# Patient Record
Sex: Female | Born: 1955 | Race: White | Hispanic: No | Marital: Married | Smoking: Current every day smoker
Health system: Southern US, Community
[De-identification: ages and names within clinical notes are randomized; demographics above are authoritative.]

## PROBLEM LIST (undated history)

## (undated) DIAGNOSIS — N393 Stress incontinence (female) (male): Secondary | ICD-10-CM

## (undated) DIAGNOSIS — F32A Depression, unspecified: Secondary | ICD-10-CM

## (undated) DIAGNOSIS — F329 Major depressive disorder, single episode, unspecified: Secondary | ICD-10-CM

## (undated) DIAGNOSIS — B019 Varicella without complication: Secondary | ICD-10-CM

## (undated) DIAGNOSIS — M1990 Unspecified osteoarthritis, unspecified site: Secondary | ICD-10-CM

## (undated) DIAGNOSIS — I10 Essential (primary) hypertension: Secondary | ICD-10-CM

## (undated) HISTORY — DX: Unspecified osteoarthritis, unspecified site: M19.90

## (undated) HISTORY — DX: Depression, unspecified: F32.A

## (undated) HISTORY — PX: ROTATOR CUFF REPAIR: SHX139

## (undated) HISTORY — DX: Essential (primary) hypertension: I10

## (undated) HISTORY — DX: Stress incontinence (female) (male): N39.3

## (undated) HISTORY — DX: Varicella without complication: B01.9

## (undated) HISTORY — DX: Major depressive disorder, single episode, unspecified: F32.9

## (undated) HISTORY — PX: KNEE SURGERY: SHX244

---

## 2007-10-26 ENCOUNTER — Ambulatory Visit: Payer: Self-pay | Admitting: Orthopaedic Surgery

## 2007-11-10 ENCOUNTER — Ambulatory Visit: Payer: Self-pay | Admitting: Orthopaedic Surgery

## 2008-01-04 ENCOUNTER — Ambulatory Visit: Payer: Self-pay | Admitting: Family Medicine

## 2010-01-06 ENCOUNTER — Ambulatory Visit: Payer: Self-pay | Admitting: Family Medicine

## 2012-02-22 ENCOUNTER — Ambulatory Visit: Payer: Self-pay | Admitting: Unknown Physician Specialty

## 2012-03-20 ENCOUNTER — Ambulatory Visit: Payer: Self-pay | Admitting: Unknown Physician Specialty

## 2015-03-30 NOTE — Op Note (Signed)
PATIENT NAME:  Theresa Bridges, Theresa B MR#:  [id number] DATE OF BIRTH:  1956-11-02  DATE OF PROCEDURE:  03/20/2012  PREOPERATIVE DIAGNOSIS: Torn medial meniscus, right knee.   POSTOPERATIVE DIAGNOSIS: Torn medial meniscus plus grade 3 medial femoral chondral changes.   OPERATION: Arthroscopic partial medial meniscectomy on the right plus debridement and coblation of the medial femoral chondral lesion.   SURGEON: Theresa Bridges, Jr., M.D.   ANESTHESIA: General.   HISTORY: The patient had a long history of right knee pain. Plain films have revealed very minimal narrowing of her medial compartment. MRI was consistent primarily with a torn medial meniscus.   The patient was ultimately brought in for arthroscopy of her right knee due to failure to respond to conservative treatment.   PROCEDURE: The patient was taken the operating room where satisfactory general anesthesia was achieved. A tourniquet and legholder were applied to her right thigh and the left lower extremity was supported with a well legholder. The right knee was then prepped and draped in the usual fashion for a knee procedure.   An inflow cannula was introduced superomedially. The joint was distended with lactated Ringer's. We used the Mitek fluid pump to facilitate joint distention.   The scope was introduced through an inferolateral puncture wound and a probe through an inferomedial puncture wound. Inspection of the medial compartment revealed the patient had grade 3 chondral changes over most of her medial femoral condyle. There was some fibrillation of her medial tibial plateau. It was an inferiorly based incomplete tear of the posterior horn of the medial meniscus.   Inspection of the intercondylar notch failed to reveal that the cruciates were intact. Inspection of the lateral compartment along with probing of the lateral meniscus failed to reveal any significant pathology.   The scope was placed back in the medial compartment. I  went ahead and resected the medial meniscus tear. I actually just resected a portion of the inner margin of the posterior horn and then used a basket biter to resect the inferior leaf of the torn medial meniscus. A synovial resector was inserted to further debride this area. Next, I trephinated the posterior horn using an 18-gauge spinal needle.   I then introduced a Turbo whisker to debride the medial femoral chondral lesion. A basket biter was also inserted to remove some loose fragments around the edge of the lesion and then I inserted an ArthroCare Paragon wand into the medial compartment and used it to coblate the lesion.   The scope was then introduced into the posterior recess of the medial and lateral compartments. No additional pathology was noted. The trochlear groove appeared to be fairly smooth. The patella was observed primarily from the superomedial portal. There was some fraying of the undersurface of the patella but her patellar involvement was only mild.  The patella, incidentally, seemed to track normally as viewed from the superomedial portal.   The joint was decompressed through the cannulas at this time and the cannulas were removed. The puncture wounds were closed with 3-0 nylon in vertical mattress fashion. Several mL of 0.5% Marcaine with epinephrine were injected about each puncture wound. Betadine was applied to the wound followed by a sterile dressing and an ice pack.   The patient was then awakened and transferred to her stretcher bed. She was taken to the recovery room in satisfactory condition. Blood loss was negligible. The tourniquet, incidentally, was not inflated during the course of the procedure.   ____________________________ Theresa BertholdHarold B. Mohammedali Bedoy Jr., MD  hbk:bjt D: 03/20/2012 13:09:31 ET T: 03/20/2012 16:48:04 ET JOB#: [id number]  cc: Theresa Berthold., MD, <Dictator> Randon Goldsmith, JR MD ELECTRONICALLY SIGNED 03/26/2012 21:33

## 2015-04-01 ENCOUNTER — Ambulatory Visit: Payer: Self-pay | Admitting: Primary Care

## 2015-04-30 ENCOUNTER — Encounter: Payer: Self-pay | Admitting: Primary Care

## 2015-04-30 ENCOUNTER — Encounter (INDEPENDENT_AMBULATORY_CARE_PROVIDER_SITE_OTHER): Payer: Self-pay

## 2015-04-30 ENCOUNTER — Ambulatory Visit (INDEPENDENT_AMBULATORY_CARE_PROVIDER_SITE_OTHER): Payer: BC Managed Care – PPO | Admitting: Primary Care

## 2015-04-30 VITALS — BP 186/112 | HR 110 | Temp 98.6°F | Ht 63.5 in | Wt 178.8 lb

## 2015-04-30 DIAGNOSIS — I10 Essential (primary) hypertension: Secondary | ICD-10-CM

## 2015-04-30 DIAGNOSIS — F32A Depression, unspecified: Secondary | ICD-10-CM

## 2015-04-30 DIAGNOSIS — F419 Anxiety disorder, unspecified: Secondary | ICD-10-CM | POA: Insufficient documentation

## 2015-04-30 DIAGNOSIS — F329 Major depressive disorder, single episode, unspecified: Secondary | ICD-10-CM

## 2015-04-30 MED ORDER — LISINOPRIL 10 MG PO TABS: 10.0000 mg | ORAL_TABLET | Freq: Every day | ORAL | Status: DC

## 2015-04-30 MED ORDER — VENLAFAXINE HCL ER 150 MG PO CP24: 150.0000 mg | ORAL_CAPSULE | Freq: Every day | ORAL | Status: DC

## 2015-04-30 NOTE — Assessment & Plan Note (Signed)
History of elevated readings without diagnosis. Elevated today in office. Start lisinopril 10 mg tablets daily. Side effects explained. BMP next visit. Follow up in 2 weeks for re-evaluation.

## 2015-04-30 NOTE — Patient Instructions (Signed)
Your blood pressure is elevated in our office today. Start Lisinopril 10 mg tablets. Take 1 tablet by mouth once daily for high blood pressure. Refills have been provided for your Effexor. Please schedule a physical with me in the next 2-3 months at your convienence. You will also schedule a lab only appointment one week prior. We will discuss your lab results during your physical. It was a pleasure to meet you today! Please don't hesitate to call me with any questions. Welcome to [facility]!  Follow up in 2 weeks for recheck of your blood pressure.  Hypertension Hypertension, commonly called high blood pressure, is when the force of blood pumping through your arteries is too strong. Your arteries are the blood vessels that carry blood from your heart throughout your body. A blood pressure reading consists of a higher number over a lower number, such as 110/72. The higher number (systolic) is the pressure inside your arteries when your heart pumps. The lower number (diastolic) is the pressure inside your arteries when your heart relaxes. Ideally you want your blood pressure below 120/80. Hypertension forces your heart to work harder to pump blood. Your arteries may become narrow or stiff. Having hypertension puts you at risk for heart disease, stroke, and other problems.  RISK FACTORS Some risk factors for high blood pressure are controllable. Others are not.  Risk factors you cannot control include:   Race. You may be at higher risk if you are African American.  Age. Risk increases with age.  Gender. Men are at higher risk than women before age 57 years. After age 365, women are at higher risk than men. Risk factors you can control include:  Not getting enough exercise or physical activity.  Being overweight.  Getting too much fat, sugar, calories, or salt in your diet.  Drinking too much alcohol. SIGNS AND SYMPTOMS Hypertension does not usually cause signs or symptoms. Extremely high  blood pressure (hypertensive crisis) may cause headache, anxiety, shortness of breath, and nosebleed. DIAGNOSIS  To check if you have hypertension, your health care provider will measure your blood pressure while you are seated, with your arm held at the level of your heart. It should be measured at least twice using the same arm. Certain conditions can cause a difference in blood pressure between your right and left arms. A blood pressure reading that is higher than normal on one occasion does not mean that you need treatment. If one blood pressure reading is high, ask your health care provider about having it checked again. TREATMENT  Treating high blood pressure includes making lifestyle changes and possibly taking medicine. Living a healthy lifestyle can help lower high blood pressure. You may need to change some of your habits. Lifestyle changes may include:  Following the DASH diet. This diet is high in fruits, vegetables, and whole grains. It is low in salt, red meat, and added sugars.  Getting at least 2 hours of brisk physical activity every week.  Losing weight if necessary.  Not smoking.  Limiting alcoholic beverages.  Learning ways to reduce stress. If lifestyle changes are not enough to get your blood pressure under control, your health care provider may prescribe medicine. You may need to take more than one. Work closely with your health care provider to understand the risks and benefits. HOME CARE INSTRUCTIONS  Have your blood pressure rechecked as directed by your health care provider.   Take medicines only as directed by your health care provider. Follow the directions carefully.  Blood pressure medicines must be taken as prescribed. The medicine does not work as well when you skip doses. Skipping doses also puts you at risk for problems.   Do not smoke.   Monitor your blood pressure at home as directed by your health care provider. SEEK MEDICAL CARE IF:   You  think you are having a reaction to medicines taken.  You have recurrent headaches or feel dizzy.  You have swelling in your ankles.  You have trouble with your vision. SEEK IMMEDIATE MEDICAL CARE IF:  You develop a severe headache or confusion.  You have unusual weakness, numbness, or feel faint.  You have severe chest or abdominal pain.  You vomit repeatedly.  You have trouble breathing. MAKE SURE YOU:   Understand these instructions.  Will watch your condition.  Will get help right away if you are not doing well or get worse. Document Released: 11/22/2005 Document Revised: 04/08/2014 Document Reviewed: 09/14/2013 [facility] Patient Information 2015 [place], [place]. This information is not intended to replace advice given to you by your health care provider. Make sure you discuss any questions you have with your health care provider.

## 2015-04-30 NOTE — Progress Notes (Signed)
Pre visit review using our clinic review tool, if applicable. No additional management support is needed unless otherwise documented below in the visit note. 

## 2015-04-30 NOTE — Progress Notes (Signed)
Subjective:    Patient ID: Theresa Bridges, female    DOB: [birth year], 59 y.o.   MRN: [id number]  HPI  Theresa Bridges is a 59 year old female who presents today to establish care and discuss the problems mentioned below. Will obtain old records.  1) Elevated blood pressure readings: elevated readings have been present intermittently for 2-3 years when she presented for various surgeries and had visits with other providers. She's not had consistent care by a PCP in the past 5 years. Denies headaches, but can feel when her blood pressure is elevated. Denies chest pain, SOB, visual disturbance.   BP Readings from Last 3 Encounters:  04/30/15 186/112    2) Depression: Has been present for most of her life. She is managed on Effexor XR several years ago and feels well managed on this medication. She is out of her medication and had been getting refills from Dr. Terance Hart at [facility] in [place].   3) Family history of Breast Cancer: Mother and paternal grandmother. She has not been getting routine mammograms. Last mammogram was 5 years ago. Denies lumps, pain, changes in tissue.  Review of Systems  Constitutional: Negative for unexpected weight change.  HENT: Negative for rhinorrhea.   Eyes: Negative for visual disturbance.  Respiratory: Negative for cough and shortness of breath.   Cardiovascular: Negative for chest pain and leg swelling.  Gastrointestinal: Negative for diarrhea and constipation.  Genitourinary:       Stress incontinence occasionally. Mild. Not bothersome.  Musculoskeletal: Negative for myalgias and arthralgias.  Skin:       Ganglion cyst present since September 2015.  Neurological: Negative for dizziness, numbness and headaches.  Psychiatric/Behavioral:       See HPI       Past Medical History  Diagnosis Date  . Chicken pox   . Depression   . Hypertension   . Stress incontinence     History   Social History  . Marital Status: Married    Spouse Name: N/A    . Number of Children: N/A  . Years of Education: N/A   Occupational History  . Not on file.   Social History Main Topics  . Smoking status: Current Every Day Smoker -- 0.50 packs/day    Types: Cigarettes  . Smokeless tobacco: Not on file  . Alcohol Use: 0.0 oz/week    0 Standard drinks or equivalent per week     Comment: 1 to 2 glass of wine daily  . Drug Use: Not on file  . Sexual Activity: Not on file   Other Topics Concern  . Not on file   Social History Narrative   Married.   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   Enjoys swimming, gardening, growing herbs, playing piano, reading.    Past Surgical History  Procedure Laterality Date  . Rotator cuff repair    . Knee surgery      Family History  Problem Relation Age of Onset  . Arthritis Mother   . Cancer Mother     breast  . Arthritis Father   . Hypertension Father   . Cancer Paternal Grandmother     breast    No Known Allergies  No current outpatient prescriptions on file prior to visit.   No current facility-administered medications on file prior to visit.    BP 186/112 mmHg  Pulse 110  Temp(Src) 98.6 F (37 C) (Oral)  Ht 5' 3.5" (1.613 m)  Wt 178 lb  12.8 oz (81.103 kg)  BMI 31.17 kg/m2  SpO2 97%    Objective:   Physical Exam  Constitutional: She is oriented to person, place, and time. She appears well-nourished.  Neck: Neck supple.  Cardiovascular: Normal rate and regular rhythm.   Pulmonary/Chest: Effort normal and breath sounds normal.  Musculoskeletal: Normal range of motion.  Neurological: She is alert and oriented to person, place, and time.  Skin: Skin is warm and dry.  Psychiatric: She has a normal mood and affect.          Assessment & Plan:

## 2015-04-30 NOTE — Assessment & Plan Note (Signed)
Managed well on Effexor XR. Refills provided. Denies SI/HI. Will continue to monitor.

## 2015-05-21 ENCOUNTER — Encounter: Payer: Self-pay | Admitting: Primary Care

## 2015-05-21 ENCOUNTER — Ambulatory Visit (INDEPENDENT_AMBULATORY_CARE_PROVIDER_SITE_OTHER): Payer: BC Managed Care – PPO | Admitting: Primary Care

## 2015-05-21 VITALS — BP 182/94 | HR 89 | Temp 98.1°F | Wt 175.5 lb

## 2015-05-21 DIAGNOSIS — I10 Essential (primary) hypertension: Secondary | ICD-10-CM

## 2015-05-21 LAB — BASIC METABOLIC PANEL
BUN: 10 mg/dL (ref 6–23)
CHLORIDE: 102 meq/L (ref 96–112)
CO2: 28 meq/L (ref 19–32)
Calcium: 9.4 mg/dL (ref 8.4–10.5)
Creatinine, Ser: 0.67 mg/dL (ref 0.40–1.20)
GFR: 96.31 mL/min (ref >60.00)
Glucose, Bld: 81 mg/dL (ref 70–99)
POTASSIUM: 4.5 meq/L (ref 3.5–5.1)
Sodium: 137 mEq/L (ref 135–145)

## 2015-05-21 MED ORDER — LISINOPRIL 20 MG PO TABS: 20.0000 mg | ORAL_TABLET | Freq: Every day | ORAL | Status: DC

## 2015-05-21 NOTE — Patient Instructions (Addendum)
Increase your Lisinopril from 10 mg to 20 mg. You may take 2 of the 10 mg tablets until you run out.  I will send the 20 mg tablets to your pharmacy later this afternoon.  Complete lab work prior to leaving today. I will notify you of your results.  Continue monitoring your blood pressure as you have done.  Continue your efforts of a healthy diet and exercise.  Follow up in 2 weeks for re-evaluation. It was nice to see you!  Hypertension Hypertension, commonly called high blood pressure, is when the force of blood pumping through your arteries is too strong. Your arteries are the blood vessels that carry blood from your heart throughout your body. A blood pressure reading consists of a higher number over a lower number, such as 110/72. The higher number (systolic) is the pressure inside your arteries when your heart pumps. The lower number (diastolic) is the pressure inside your arteries when your heart relaxes. Ideally you want your blood pressure below 120/80. Hypertension forces your heart to work harder to pump blood. Your arteries may become narrow or stiff. Having hypertension puts you at risk for heart disease, stroke, and other problems.  RISK FACTORS Some risk factors for high blood pressure are controllable. Others are not.  Risk factors you cannot control include:   Race. You may be at higher risk if you are African American.  Age. Risk increases with age.  Gender. Men are at higher risk than women before age 73 years. After age 15, women are at higher risk than men. Risk factors you can control include:  Not getting enough exercise or physical activity.  Being overweight.  Getting too much fat, sugar, calories, or salt in your diet.  Drinking too much alcohol. SIGNS AND SYMPTOMS Hypertension does not usually cause signs or symptoms. Extremely high blood pressure (hypertensive crisis) may cause headache, anxiety, shortness of breath, and nosebleed. DIAGNOSIS  To check if  you have hypertension, your health care provider will measure your blood pressure while you are seated, with your arm held at the level of your heart. It should be measured at least twice using the same arm. Certain conditions can cause a difference in blood pressure between your right and left arms. A blood pressure reading that is higher than normal on one occasion does not mean that you need treatment. If one blood pressure reading is high, ask your health care provider about having it checked again. TREATMENT  Treating high blood pressure includes making lifestyle changes and possibly taking medicine. Living a healthy lifestyle can help lower high blood pressure. You may need to change some of your habits. Lifestyle changes may include:  Following the DASH diet. This diet is high in fruits, vegetables, and whole grains. It is low in salt, red meat, and added sugars.  Getting at least 2 hours of brisk physical activity every week.  Losing weight if necessary.  Not smoking.  Limiting alcoholic beverages.  Learning ways to reduce stress. If lifestyle changes are not enough to get your blood pressure under control, your health care provider may prescribe medicine. You may need to take more than one. Work closely with your health care provider to understand the risks and benefits. HOME CARE INSTRUCTIONS  Have your blood pressure rechecked as directed by your health care provider.   Take medicines only as directed by your health care provider. Follow the directions carefully. Blood pressure medicines must be taken as prescribed. The medicine does not work  as well when you skip doses. Skipping doses also puts you at risk for problems.   Do not smoke.   Monitor your blood pressure at home as directed by your health care provider. SEEK MEDICAL CARE IF:   You think you are having a reaction to medicines taken.  You have recurrent headaches or feel dizzy.  You have swelling in your  ankles.  You have trouble with your vision. SEEK IMMEDIATE MEDICAL CARE IF:  You develop a severe headache or confusion.  You have unusual weakness, numbness, or feel faint.  You have severe chest or abdominal pain.  You vomit repeatedly.  You have trouble breathing. MAKE SURE YOU:   Understand these instructions.  Will watch your condition.  Will get help right away if you are not doing well or get worse. Document Released: 11/22/2005 Document Revised: 04/08/2014 Document Reviewed: 09/14/2013 [facility] Patient Information 2015 [place], [place]. This information is not intended to replace advice given to you by your health care provider. Make sure you discuss any questions you have with your health care provider.

## 2015-05-21 NOTE — Progress Notes (Signed)
Pre visit review using our clinic review tool, if applicable. No additional management support is needed unless otherwise documented below in the visit note. 

## 2015-05-21 NOTE — Assessment & Plan Note (Signed)
Readings at home show improvement but not at goal. Elevated reading today in clinic. Increase Lisinopril to 20 mg. BMP today for evaluation of potassium. Follow up in 2 weeks.

## 2015-05-21 NOTE — Progress Notes (Signed)
   Subjective:    Patient ID: Theresa Bridges, female    DOB: Mar 11, 1956, 59 y.o.   MRN: [id number]  HPI  Theresa Bridges is a 59 year old female who presents today for follow up of hypertension. She was diagnosed during her new patient visit on 5/25 and placed on Lisinopril 10 mg tablets. Immediatly after staring the Lisinopril she began feeling much better overall. She purchased a blood pressure cuff from [facility] and has been recording her readings. On average she has been getting 140's/90's. She's been recently exercising daily at the [facility]. She does report head pressure that has been present over the past 2 days. BP noted to be elevated in clinic today.   BP Readings from Last 3 Encounters:  05/21/15 182/94  04/30/15 186/112     Review of Systems  Eyes: Negative for visual disturbance.  Respiratory: Negative for cough and shortness of breath.   Cardiovascular: Negative for chest pain.  Neurological: Positive for headaches. Negative for dizziness.       Past Medical History  Diagnosis Date  . Chicken pox   . Depression   . Hypertension   . Stress incontinence     History   Social History  . Marital Status: Married    Spouse Name: N/A  . Number of Children: N/A  . Years of Education: N/A   Occupational History  . Not on file.   Social History Main Topics  . Smoking status: Current Every Day Smoker -- 0.50 packs/day    Types: Cigarettes  . Smokeless tobacco: Not on file  . Alcohol Use: 0.0 oz/week    0 Standard drinks or equivalent per week     Comment: 1 to 2 glass of wine daily  . Drug Use: Not on file  . Sexual Activity: Not on file   Other Topics Concern  . Not on file   Social History Narrative   Married.   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   Enjoys swimming, gardening, growing herbs, playing piano, reading.    Past Surgical History  Procedure Laterality Date  . Rotator cuff repair    . Knee surgery      Family History  Problem Relation Age of  Onset  . Arthritis Mother   . Cancer Mother     breast  . Arthritis Father   . Hypertension Father   . Cancer Paternal Grandmother     breast    No Known Allergies  Current Outpatient Prescriptions on File Prior to Visit  Medication Sig Dispense Refill  . lisinopril (PRINIVIL,ZESTRIL) 10 MG tablet Take 1 tablet (10 mg total) by mouth daily. 30 tablet 3  . venlafaxine XR (EFFEXOR-XR) 150 MG 24 hr capsule Take 1 capsule (150 mg total) by mouth daily with breakfast. 30 capsule 11   No current facility-administered medications on file prior to visit.    BP 182/94 mmHg  Pulse 89  Temp(Src) 98.1 F (36.7 C) (Oral)  Wt 175 lb 8 oz (79.606 kg)  SpO2 95%    Objective:   Physical Exam  Cardiovascular: Normal rate and regular rhythm.   Pulmonary/Chest: Effort normal and breath sounds normal.  Skin: Skin is warm and dry.          Assessment & Plan:

## 2015-06-03 ENCOUNTER — Other Ambulatory Visit: Payer: Self-pay | Admitting: Primary Care

## 2015-06-03 DIAGNOSIS — I10 Essential (primary) hypertension: Secondary | ICD-10-CM

## 2015-06-03 DIAGNOSIS — Z0000 Encounter for general adult medical examination without abnormal findings: Secondary | ICD-10-CM

## 2015-06-04 ENCOUNTER — Ambulatory Visit (INDEPENDENT_AMBULATORY_CARE_PROVIDER_SITE_OTHER): Payer: BC Managed Care – PPO | Admitting: Primary Care

## 2015-06-04 ENCOUNTER — Encounter: Payer: Self-pay | Admitting: Primary Care

## 2015-06-04 VITALS — BP 170/84 | HR 110 | Temp 97.5°F | Wt 176.5 lb

## 2015-06-04 DIAGNOSIS — I10 Essential (primary) hypertension: Secondary | ICD-10-CM

## 2015-06-04 MED ORDER — HYDROCHLOROTHIAZIDE 25 MG PO TABS: 25.0000 mg | ORAL_TABLET | Freq: Every day | ORAL | Status: DC

## 2015-06-04 NOTE — Progress Notes (Signed)
Pre visit review using our clinic review tool, if applicable. No additional management support is needed unless otherwise documented below in the visit note. 

## 2015-06-04 NOTE — Patient Instructions (Signed)
Start Hydrochlorothiazide tablets for blood pressure. Take 1 tablet by mouth daily. Continue taking Lisinopril 20 mg tablets.  Keep up the good work with exercise and diet!  Follow up in 3 weeks for re-evaluation of blood pressure.   It was nice seeing you!

## 2015-06-04 NOTE — Progress Notes (Signed)
   Subjective:    Patient ID: Theresa Bridges, female    DOB: 1956/02/27, 59 y.o.   MRN: [id number]  HPI  Theresa Bridges is a 59 year old female who presents today for follow up. She was evaluated on 5/25 as a new patient and was started on Lisinopril 10 mg for hypertension. She then followed up on 6/15 and was noted to still be hypertensive. Her dose was increased to 20 mg last visit.  Her blood pressure remains elevated today. She is [facility] at the [facility] including weights, cardio, and swimming. She's improved her diet and is incorporating fresh fruits and vegetables. Overall she feels great. Denies headaches, chest pain, shortness of breath.    BP Readings from Last 3 Encounters:  06/04/15 170/84  05/21/15 182/94  04/30/15 186/112      Review of Systems  Eyes: Negative for visual disturbance.  Respiratory: Negative for shortness of breath.   Cardiovascular: Negative for chest pain.  Neurological: Negative for headaches.       Past Medical History  Diagnosis Date  . Chicken pox   . Depression   . Hypertension   . Stress incontinence     History   Social History  . Marital Status: Married    Spouse Name: N/A  . Number of Children: N/A  . Years of Education: N/A   Occupational History  . Not on file.   Social History Main Topics  . Smoking status: Current Every Day Smoker -- 0.20 packs/day    Types: Cigarettes  . Smokeless tobacco: Not on file  . Alcohol Use: 0.0 oz/week    0 Standard drinks or equivalent per week     Comment: 1 to 2 glass of wine daily  . Drug Use: No  . Sexual Activity: Not on file   Other Topics Concern  . Not on file   Social History Narrative   Married.   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   Enjoys swimming, gardening, growing herbs, playing piano, reading.    Past Surgical History  Procedure Laterality Date  . Rotator cuff repair    . Knee surgery      Family History  Problem Relation Age of Onset  . Arthritis Mother    . Cancer Mother     breast  . Arthritis Father   . Hypertension Father   . Cancer Paternal Grandmother     breast    No Known Allergies  Current Outpatient Prescriptions on File Prior to Visit  Medication Sig Dispense Refill  . lisinopril (PRINIVIL,ZESTRIL) 20 MG tablet Take 1 tablet (20 mg total) by mouth daily. 30 tablet 3  . venlafaxine XR (EFFEXOR-XR) 150 MG 24 hr capsule Take 1 capsule (150 mg total) by mouth daily with breakfast. 30 capsule 11   No current facility-administered medications on file prior to visit.    BP 170/84 mmHg  Pulse 110  Temp(Src) 97.5 F (36.4 C) (Axillary)  Wt 176 lb 8 oz (80.06 kg)  SpO2 97%    Objective:   Physical Exam  Constitutional: She appears well-nourished.  Cardiovascular: Normal rate and regular rhythm.   Pulmonary/Chest: Effort normal and breath sounds normal.  Skin: Skin is warm and dry.          Assessment & Plan:

## 2015-06-04 NOTE — Assessment & Plan Note (Addendum)
Uncontrolled, very little improvement. Endorses healthy diet and daily exercise. Add HCTZ 25 mg to Lisinopril 20 mg. Follow up in 3 weeks for re-evaluation, if not controlled will increase lisinopril.

## 2015-06-12 ENCOUNTER — Other Ambulatory Visit: Payer: BC Managed Care – PPO

## 2015-06-16 ENCOUNTER — Encounter: Payer: BC Managed Care – PPO | Admitting: Primary Care

## 2015-06-24 ENCOUNTER — Ambulatory Visit: Payer: BC Managed Care – PPO | Admitting: Primary Care

## 2015-07-15 ENCOUNTER — Telehealth: Payer: Self-pay | Admitting: *Deleted

## 2015-07-15 NOTE — Telephone Encounter (Signed)
Mammogram call: Left voicemail requesting pt to call us back to check status of her getting a mammogram

## 2015-08-04 ENCOUNTER — Other Ambulatory Visit: Payer: BC Managed Care – PPO

## 2015-08-12 ENCOUNTER — Encounter: Payer: BC Managed Care – PPO | Admitting: Primary Care

## 2015-08-21 ENCOUNTER — Other Ambulatory Visit: Payer: Self-pay | Admitting: Primary Care

## 2015-08-31 ENCOUNTER — Other Ambulatory Visit: Payer: Self-pay | Admitting: Primary Care

## 2015-09-01 NOTE — Telephone Encounter (Signed)
Message left for patient to return my call to schedule follow up appt.

## 2015-09-01 NOTE — Telephone Encounter (Signed)
Electronically refill request for  hydrochlorothiazide (HYDRODIURIL) 25 MG tablet   Take 1 tablet (25 mg total) by mouth daily.  Dispense: 30 tablet   Refills: 3     Last prescribed on 06/04/2015. Last seen on 06/04/15. Last appt on 08/12/15 was cancel.

## 2015-10-31 ENCOUNTER — Other Ambulatory Visit: Payer: Self-pay | Admitting: Primary Care

## 2015-11-03 NOTE — Telephone Encounter (Signed)
Electronically refill request for   hydrochlorothiazide (HYDRODIURIL) 25 MG tablet   Take 1 tablet (25 mg total) by mouth daily.  Dispense: 30 tablet   Refills: 3    Last prescribed on  06/04/2015. Last seen on 06/04/2015. No future appointment.

## 2015-11-06 NOTE — Telephone Encounter (Signed)
Message left for patient to return my call on 11/04/2015 and 11/06/2015

## 2015-11-06 NOTE — Telephone Encounter (Addendum)
Patient called back. Just wanted to let you know. Patient stated that she is taking lisinopril and have 1 more refill on it. Patient stated that if is okay for her to follow up in February because of financial issues. Patient still at work and will call back later.

## 2015-11-19 ENCOUNTER — Other Ambulatory Visit: Payer: Self-pay | Admitting: Primary Care

## 2015-11-19 NOTE — Telephone Encounter (Signed)
Electronically refill request for   lisinopril (PRINIVIL,ZESTRIL) 20 MG tablet   Take 1 tablet (20 mg total) by mouth daily. **Will need office visit for further refills**  Dispense: 30 tablet   Refills: 2     Last prescribed on 08/22/2015. Last seen on 06/04/15. No future appointment.

## 2015-11-20 NOTE — Telephone Encounter (Signed)
Left patient a message to call back on 11/19/2015. Will also send MyChart message to patient and letter.

## 2015-11-24 NOTE — Telephone Encounter (Signed)
On previous phone patient stated that she will call back in February to schedule a follow up. She cannot yet due financial issues.

## 2015-11-26 ENCOUNTER — Telehealth: Payer: Self-pay | Admitting: Primary Care

## 2015-11-26 DIAGNOSIS — I10 Essential (primary) hypertension: Secondary | ICD-10-CM

## 2015-11-26 MED ORDER — HYDROCHLOROTHIAZIDE 25 MG PO TABS: 25.0000 mg | ORAL_TABLET | Freq: Every day | ORAL | Status: DC

## 2015-11-26 NOTE — Telephone Encounter (Signed)
Noted. Refills sent. Will see her in February for CPE.

## 2015-11-26 NOTE — Telephone Encounter (Signed)
Pt came in regarding refill on hydrochlorothiazide (HYDRODIURIL) 25 MG tablet. She states as before with her other refill that she cannot come in for an appt due to financial problems. Pt is scheduled for 01/16/16 for her cpe and med refill. She would like a refill in the meantime if possible. The best number to contact her at is [phone].

## 2016-01-16 ENCOUNTER — Encounter: Payer: BC Managed Care – PPO | Admitting: Primary Care

## 2016-01-19 ENCOUNTER — Other Ambulatory Visit: Payer: Self-pay | Admitting: Primary Care

## 2016-01-19 DIAGNOSIS — I10 Essential (primary) hypertension: Secondary | ICD-10-CM

## 2016-01-19 NOTE — Telephone Encounter (Signed)
Electronically refill request for   lisinopril (PRINIVIL,ZESTRIL) 20 MG tablet   Take 1 tablet (20 mg total) by mouth daily. **Will need office visit for further refills**  Dispense: 30 tablet   Refills: 2     Last prescribed on 08/22/2015. Last seen on 06/04/2015. CPE is schedule on 03/15/2016.

## 2016-03-15 ENCOUNTER — Other Ambulatory Visit (HOSPITAL_COMMUNITY)
Admission: RE | Admit: 2016-03-15 | Discharge: 2016-03-15 | Disposition: A | Discharge status: Home / Self Care | Payer: BC Managed Care – PPO | Source: Ambulatory Visit | Attending: Primary Care | Admitting: Primary Care

## 2016-03-15 ENCOUNTER — Ambulatory Visit (INDEPENDENT_AMBULATORY_CARE_PROVIDER_SITE_OTHER): Payer: BC Managed Care – PPO | Admitting: Primary Care

## 2016-03-15 ENCOUNTER — Other Ambulatory Visit: Payer: Self-pay | Admitting: Primary Care

## 2016-03-15 VITALS — BP 160/98 | HR 97 | Temp 97.6°F | Ht 64.0 in | Wt 174.0 lb

## 2016-03-15 DIAGNOSIS — F329 Major depressive disorder, single episode, unspecified: Secondary | ICD-10-CM

## 2016-03-15 DIAGNOSIS — Z0000 Encounter for general adult medical examination without abnormal findings: Secondary | ICD-10-CM | POA: Diagnosis not present

## 2016-03-15 DIAGNOSIS — Z23 Encounter for immunization: Secondary | ICD-10-CM

## 2016-03-15 DIAGNOSIS — R7303 Prediabetes: Secondary | ICD-10-CM

## 2016-03-15 DIAGNOSIS — Z1239 Encounter for other screening for malignant neoplasm of breast: Secondary | ICD-10-CM | POA: Diagnosis not present

## 2016-03-15 DIAGNOSIS — I10 Essential (primary) hypertension: Secondary | ICD-10-CM

## 2016-03-15 DIAGNOSIS — Z1151 Encounter for screening for human papillomavirus (HPV): Secondary | ICD-10-CM | POA: Diagnosis not present

## 2016-03-15 DIAGNOSIS — Z1211 Encounter for screening for malignant neoplasm of colon: Secondary | ICD-10-CM

## 2016-03-15 DIAGNOSIS — Z01419 Encounter for gynecological examination (general) (routine) without abnormal findings: Secondary | ICD-10-CM | POA: Insufficient documentation

## 2016-03-15 DIAGNOSIS — Z124 Encounter for screening for malignant neoplasm of cervix: Secondary | ICD-10-CM

## 2016-03-15 DIAGNOSIS — E781 Pure hyperglyceridemia: Secondary | ICD-10-CM

## 2016-03-15 DIAGNOSIS — F32A Depression, unspecified: Secondary | ICD-10-CM

## 2016-03-15 DIAGNOSIS — R7989 Other specified abnormal findings of blood chemistry: Secondary | ICD-10-CM

## 2016-03-15 LAB — COMPREHENSIVE METABOLIC PANEL
ALBUMIN: 4.2 g/dL (ref 3.5–5.2)
ALK PHOS: 88 U/L (ref 39–117)
ALT: 15 U/L (ref 0–35)
AST: 17 U/L (ref 0–37)
BILIRUBIN TOTAL: 0.3 mg/dL (ref 0.2–1.2)
BUN: 11 mg/dL (ref 6–23)
CALCIUM: 9.4 mg/dL (ref 8.4–10.5)
CO2: 28 meq/L (ref 19–32)
CREATININE: 0.8 mg/dL (ref 0.40–1.20)
Chloride: 100 mEq/L (ref 96–112)
GFR: 78.26 mL/min (ref >60.00)
Glucose, Bld: 76 mg/dL (ref 70–99)
Potassium: 4.5 mEq/L (ref 3.5–5.1)
Sodium: 136 mEq/L (ref 135–145)
TOTAL PROTEIN: 7.5 g/dL (ref 6.0–8.3)

## 2016-03-15 LAB — LIPID PANEL
CHOLESTEROL: 194 mg/dL (ref 0–200)
HDL: 43.6 mg/dL (ref >39.00)
LDL CALC: 110 mg/dL — AB (ref 0–99)
NonHDL: 150.47
TRIGLYCERIDES: 200 mg/dL — AB (ref 0.0–149.0)
Total CHOL/HDL Ratio: 4
VLDL: 40 mg/dL (ref 0.0–40.0)

## 2016-03-15 LAB — HEMOGLOBIN A1C: HEMOGLOBIN A1C: 6.1 % (ref 4.6–6.5)

## 2016-03-15 LAB — CBC
HCT: 35.1 % — ABNORMAL LOW (ref 36.0–46.0)
HEMOGLOBIN: 11.8 g/dL — AB (ref 12.0–15.0)
MCHC: 33.6 g/dL (ref 30.0–36.0)
MCV: 96.1 fl (ref 78.0–100.0)
PLATELETS: 423 10*3/uL — AB (ref 150.0–400.0)
RBC: 3.65 Mil/uL — AB (ref 3.87–5.11)
RDW: 12.7 % (ref 11.5–15.5)
WBC: 7.7 10*3/uL (ref 4.0–10.5)

## 2016-03-15 LAB — TSH: TSH: 4.72 u[IU]/mL — ABNORMAL HIGH (ref 0.35–4.50)

## 2016-03-15 MED ORDER — LISINOPRIL 40 MG PO TABS: 40.0000 mg | ORAL_TABLET | Freq: Every day | ORAL | Status: DC

## 2016-03-15 NOTE — Assessment & Plan Note (Signed)
Tdap overdue, provided today. Pap completed today and is pending. Referral placed for colonoscopy and orders placed for mammogram.  Labs pending. Exam unremarkable. Discussed the importance of a healthy diet and regular exercise in order for weight loss and to reduce risk of other medical diseases.  Follow up in 1 year for repeat physical

## 2016-03-15 NOTE — Assessment & Plan Note (Signed)
Feels well managed on Effexor 150 mg.  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 She is working through this and feels ok. Continue same.

## 2016-03-15 NOTE — Progress Notes (Signed)
Subjective:    Patient ID: Theresa Bridges, female    DOB: Nov 02, 1956, 60 y.o.   MRN: [id number]  HPI  Theresa Bridges is a 60 year old female who presents today for complete physical.  Immunizations: -Tetanus: Unsure, believes it's been over 10 years.  -Influenza: Did not receive last season.    Diet: Endorses a healthy diet. Breakfast: Instant oatmeal with fruit Lunch: Chicken, sandwich Dinner: Skips Snacks: None Desserts: Special Occasions Beverages: Coffee, water  Exercise: She does not currently exercise.  Eye exam: Completed in 2016 Dental exam: Completed several years ago.  Colonoscopy: Never completed. Due.  Pap Smear: Completed over 3 years ago. Due. Mammogram: Completed several years ago. Due.   Review of Systems  Constitutional: Negative for unexpected weight change.  HENT: Negative for rhinorrhea.   Respiratory: Negative for cough and shortness of breath.   Cardiovascular: Negative for chest pain.  Gastrointestinal: Negative for diarrhea and constipation.  Genitourinary: Negative for difficulty urinating.  Musculoskeletal: Negative for myalgias and arthralgias.  Skin: Negative for rash.       Mass to right anterior forearm for 1.5 years. Denies recent changes in size or shape.   Allergic/Immunologic: Positive for environmental allergies.  Neurological: Negative for dizziness, numbness and headaches.  Psychiatric/Behavioral:       Overall feeling well managed       Past Medical History  Diagnosis Date  . Chicken pox   . Depression   . Hypertension   . Stress incontinence     Social History   Social History  . Marital Status: Married    Spouse Name: N/A  . Number of Children: N/A  . Years of Education: N/A   Occupational History  . Not on file.   Social History Main Topics  . Smoking status: Current Every Day Smoker -- 0.20 packs/day    Types: Cigarettes  . Smokeless tobacco: Not on file  . Alcohol Use: 0.0 oz/week    0 Standard drinks or  equivalent per week     Comment: 1 to 2 glass of wine daily  . Drug Use: No  . Sexual Activity: Not on file   Other Topics Concern  . Not on file   Social History Narrative   Married.   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   Enjoys swimming, gardening, growing herbs, playing piano, reading.    Past Surgical History  Procedure Laterality Date  . Rotator cuff repair    . Knee surgery      Family History  Problem Relation Age of Onset  . Arthritis Mother   . Cancer Mother     breast  . Arthritis Father   . Hypertension Father   . Cancer Paternal Grandmother     breast    No Known Allergies  Current Outpatient Prescriptions on File Prior to Visit  Medication Sig Dispense Refill  . hydrochlorothiazide (HYDRODIURIL) 25 MG tablet Take 1 tablet (25 mg total) by mouth daily. 90 tablet 0  . venlafaxine XR (EFFEXOR-XR) 150 MG 24 hr capsule Take 1 capsule (150 mg total) by mouth daily with breakfast. 30 capsule 11   No current facility-administered medications on file prior to visit.    BP 160/98 mmHg  Pulse 97  Temp(Src) 97.6 F (36.4 C) (Oral)  Ht 5\' 4"  (1.626 m)  Wt 174 lb (78.926 kg)  BMI 29.85 kg/m2  SpO2 98%    Objective:   Physical Exam  Constitutional: She is oriented to person, place,  and time. She appears well-nourished.  HENT:  Right Ear: Tympanic membrane and ear canal normal.  Left Ear: Tympanic membrane and ear canal normal.  Nose: Nose normal.  Mouth/Throat: Oropharynx is clear and moist.  Eyes: Conjunctivae and EOM are normal. Pupils are equal, round, and reactive to light.  Neck: Neck supple. No thyromegaly present.  Cardiovascular: Normal rate and regular rhythm.   No murmur heard. Pulmonary/Chest: Effort normal and breath sounds normal. She has no rales.  Abdominal: Soft. Bowel sounds are normal. There is no tenderness.  Genitourinary: Cervix exhibits no motion tenderness and no discharge. Right adnexum displays no tenderness. Left adnexum  displays no tenderness. No vaginal discharge found.  Musculoskeletal: Normal range of motion.  Lymphadenopathy:    She has no cervical adenopathy.  Neurological: She is alert and oriented to person, place, and time. She has normal reflexes. No cranial nerve deficit.  Skin: Skin is warm and dry. No rash noted.  Psychiatric: She has a normal mood and affect.          Assessment & Plan:

## 2016-03-15 NOTE — Assessment & Plan Note (Signed)
Above goal on HCTZ 25 mg and Lisinopril 20 mg. Never returned for follow up appointment as recommended last year. Increase Lisinopril to 40 mg daily. Continue HCTZ. Will have her check BP over next 2 weeks and call in with readings. Will continue to closely monitor. BMP pending today.

## 2016-03-15 NOTE — Patient Instructions (Addendum)
We've increased your lisinopril dose from 20 mg to 40 mg. You may take 2 of the 20 mg tablets until your current bottle is empty. I've sent in the 40 mg tablets to your pharmacy. Take 1 tablet by mouth once every morning.  You will be contacted regarding your referral to GI for the colonoscopy and also for your mammogram.  Please let us know if you have not heard back within one week.   Complete lab work prior to leaving today. I will notify you of your results once received.   Check your blood pressure daily, around the same time of day, for the next 2 weeks.   Ensure that you have rested for 30 minutes prior to checking your blood pressure. Record your readings and I will call you in 2 weeks.  Start exercising. You should be getting 1 hour of moderate intensity exercise 5 days weekly.  It was a pleasure to see you today!

## 2016-03-15 NOTE — Progress Notes (Signed)
Pre visit review using our clinic review tool, if applicable. No additional management support is needed unless otherwise documented below in the visit note. 

## 2016-03-16 LAB — CYTOLOGY - PAP

## 2016-04-22 ENCOUNTER — Other Ambulatory Visit: Payer: Self-pay | Admitting: Primary Care

## 2016-04-22 NOTE — Telephone Encounter (Signed)
Electronically refill request for

## 2016-05-25 ENCOUNTER — Other Ambulatory Visit: Payer: Self-pay | Admitting: Primary Care

## 2016-10-04 ENCOUNTER — Telehealth: Payer: Self-pay | Admitting: Primary Care

## 2016-10-04 NOTE — Telephone Encounter (Signed)
[facility] - Client TELEPHONE ADVICE RECORD [facility]  Patient Name: Theresa DienerMARI Loera  DOB: Mar 05, 1956    Initial Comment Caller states has a cyst on her wrist that looks like a second head that is painful.    Nurse Assessment  Nurse: Laural BenesJohnson, RN, Dondra SpryGail Date/Time Lamount Cohen(Eastern Time): 10/04/2016 4:48:38 PM  Confirm and document reason for call. If symptomatic, describe symptoms. You must click the next button to save text entered. ---Larey BrickMari has ganglin cyst and has gotten worse and getting worse --needs aspirated  Has the patient traveled out of the country within the last 30 days? ---No  Does the patient have any new or worsening symptoms? ---Yes  Will a triage be completed? ---Yes  Related visit to physician within the last 2 weeks? ---No  Does the PT have any chronic conditions? (i.e. diabetes, asthma, etc.) ---No  Is this a behavioral health or substance abuse call? ---No     Guidelines    Guideline Title Affirmed Question Affirmed Notes  Skin Lesion - Moles or Growths Caller cannot describe it clearly    Final Disposition User   See Physician within 4 Hours (or PCP triage) Laural BenesJohnson, RN, Dondra SpryGail    Referrals  REFERRED TO PCP OFFICE   Disagree/Comply: Danella Maiersomply

## 2016-10-04 NOTE — Telephone Encounter (Signed)
Pt has appt on 10/12/16 at 12:30 with Mayra ReelKate Clark NP.

## 2016-10-05 NOTE — Telephone Encounter (Signed)
Per DPR, left detail message of Kate's comments. 

## 2016-10-05 NOTE — Telephone Encounter (Signed)
Please notify patient that I am happy to look at her cyst to the wrist to evaluate, but this may not be able to be drained in our clinic. Often times we refer to hand surgery for removal of the subcutaneous sac that is associated with the cyst growth. I will need to see it before I make the referral, so she is welcome to come in.

## 2016-10-08 ENCOUNTER — Encounter: Payer: Self-pay | Admitting: Primary Care

## 2016-10-12 ENCOUNTER — Ambulatory Visit: Payer: BC Managed Care – PPO | Admitting: Primary Care

## 2016-11-03 ENCOUNTER — Encounter: Payer: Self-pay | Admitting: Primary Care

## 2016-11-04 ENCOUNTER — Other Ambulatory Visit: Payer: Self-pay | Admitting: Primary Care

## 2017-04-20 ENCOUNTER — Other Ambulatory Visit: Payer: Self-pay | Admitting: Primary Care

## 2017-05-27 ENCOUNTER — Telehealth: Payer: Self-pay | Admitting: Primary Care

## 2017-05-27 NOTE — Telephone Encounter (Signed)
[facility] Client TELEPHONE ADVICE RECORD   [facility]     Patient Name: Theresa Bridges Initial Comment Caller has had severe knee pain for the last week. Didn't fall or injure it. She tripped last night on a tennis ball and fell on that knee and made the pain worse.   DOB: September 01, 1956      Nurse Assessment  Nurse: Harlon FlorWhitaker, RN, Darl PikesSusan Date/Time (Eastern Time): 05/27/2017 4:23:28 PM  Confirm and document reason for call. If symptomatic, describe symptoms. ---Caller has had severe Left knee pain for 2 weeks . Didn't fall or injure it. She tripped last night on a tennis ball and fell on that knee and made the pain worse. no surgery on this knee. There is some swelling of knee . Pain level is 6/10 and is constant and affecting walking. she states she did not see the tennis ball and tripped on it . no fever  Does the patient have any new or worsening symptoms? ---Yes  Will a triage be completed? ---Yes  Related visit to physician within the last 2 weeks? ---No  Does the PT have any chronic conditions? (i.e. diabetes, asthma, etc.) ---Yes  List chronic conditions. ---HTN depression  Is this a behavioral health or substance abuse call? ---No    Guidelines     Guideline Title Affirmed Question Affirmed Notes   Knee Injury Can't stand (bear weight) or walk    Final Disposition User   Go to ED Now Harlon FlorWhitaker, RN, Darl PikesSusan     Referrals   [facility] - ED   Disagree/Comply: Comply

## 2017-05-27 NOTE — Telephone Encounter (Signed)
Please call patient Monday to check on her. Will see her in the clinic if she hasn't already been seen in the ED or Urgent Care.

## 2017-05-30 NOTE — Telephone Encounter (Signed)
Per DPR, left detail message of Kate's comments for patient to call back. 

## 2017-06-01 NOTE — Telephone Encounter (Signed)
Per DPR, left detail message of Kate's comments for patient to call back. 

## 2017-06-02 NOTE — Telephone Encounter (Signed)
Send patient a message through MyChart. 

## 2017-06-21 ENCOUNTER — Other Ambulatory Visit: Payer: Self-pay | Admitting: Primary Care

## 2017-07-11 ENCOUNTER — Ambulatory Visit (INDEPENDENT_AMBULATORY_CARE_PROVIDER_SITE_OTHER): Payer: BC Managed Care – PPO | Admitting: Primary Care

## 2017-07-11 ENCOUNTER — Encounter: Payer: Self-pay | Admitting: Primary Care

## 2017-07-11 ENCOUNTER — Ambulatory Visit (INDEPENDENT_AMBULATORY_CARE_PROVIDER_SITE_OTHER)
Admission: RE | Admit: 2017-07-11 | Discharge: 2017-07-11 | Disposition: A | Discharge status: Home / Self Care | Payer: BC Managed Care – PPO | Source: Ambulatory Visit | Attending: Primary Care | Admitting: Primary Care

## 2017-07-11 VITALS — BP 146/80 | HR 64 | Temp 98.1°F | Ht 64.0 in | Wt 177.1 lb

## 2017-07-11 DIAGNOSIS — R7303 Prediabetes: Secondary | ICD-10-CM | POA: Diagnosis not present

## 2017-07-11 DIAGNOSIS — I10 Essential (primary) hypertension: Secondary | ICD-10-CM

## 2017-07-11 DIAGNOSIS — M25562 Pain in left knee: Secondary | ICD-10-CM

## 2017-07-11 DIAGNOSIS — Z1239 Encounter for other screening for malignant neoplasm of breast: Secondary | ICD-10-CM

## 2017-07-11 MED ORDER — HYDROCHLOROTHIAZIDE 25 MG PO TABS: 25.0000 mg | ORAL_TABLET | Freq: Every day | ORAL | 3 refills | Status: DC

## 2017-07-11 MED ORDER — LISINOPRIL 40 MG PO TABS: 40.0000 mg | ORAL_TABLET | Freq: Every day | ORAL | 3 refills | Status: DC

## 2017-07-11 NOTE — Addendum Note (Signed)
Addended by: Alvina ChouWALSH, TERRI J on: 07/11/2017 03:13 PM   Modules accepted: Orders

## 2017-07-11 NOTE — Assessment & Plan Note (Signed)
Overall improving with diet changes, still not at goal, not checking home readings. Recommended she keep track of home readings and notify me of readings at or above 135/90. Refills sent of lisinopril and HCTZ. BMP pending today.

## 2017-07-11 NOTE — Patient Instructions (Signed)
Complete lab work and xray prior to leaving today. I will notify you of your results once received.   Continue to wear the knee sleeve for support.  You may take Ibuprofen as needed for pain and inflammation.  I sent refills of your medications to your pharmacy.  I'll be in touch soon!  It was a pleasure to see you today!

## 2017-07-11 NOTE — Progress Notes (Signed)
Subjective:    Patient ID: Theresa Bridges, female    DOB: Sep 26, 1956, 61 y.o.   MRN: [id number]  HPI  Theresa Bridges is a 61 year old female who presents today with a chief complaint of knee pain and medication refill.  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 She then tripped over a tennis ball 1 week later; then another week later was pulled off of the front porch steps while walking her dog. The pain is waking her up from sleep at night. She's tried propping her knee with a pillow, resting/elevating her knee, wearing a knee compression sleeve, changing her shoes without improvement. She's noticing "popping" and weakness to the knee.   2) Essential Hypertension: Currently prescribed HCTZ 25 mg and Lisinopril 40 mg. Her BP in the office today is 146/80. She's not checking her BP at home. She is working on weight loss through improvement in diet. She denies chest pain, dizziness, shortness of breath. She is needing refills.  BP Readings from Last 3 Encounters:  07/11/17 (!) 146/80  03/15/16 (!) 160/98  06/04/15 (!) 170/84     Review of Systems  Constitutional: Negative for fatigue.  Eyes: Negative for visual disturbance.  Respiratory: Negative for shortness of breath.   Cardiovascular: Negative for chest pain.  Musculoskeletal: Positive for arthralgias and joint swelling.       Left knee pain  Neurological: Positive for weakness.       Past Medical History:  Diagnosis Date  . Chicken pox   . Depression   . Hypertension   . Stress incontinence      Social History   Social History  . Marital status: Married    Spouse name: N/A  . Number of children: N/A  . Years of education: N/A   Occupational History  . Not on file.   Social History Main Topics  . Smoking status: Current Every Day Smoker    Packs/day: 0.20    Types: Cigarettes  . Smokeless tobacco: Never Used  .  Alcohol use 0.0 oz/week     Comment: 1 to 2 glass of wine daily  . Drug use: No  . Sexual activity: Not on file   Other Topics Concern  . Not on file   Social History Narrative   Married.   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   Enjoys swimming, gardening, growing herbs, playing piano, reading.    Past Surgical History:  Procedure Laterality Date  . KNEE SURGERY    . ROTATOR CUFF REPAIR      Family History  Problem Relation Age of Onset  . Arthritis Mother   . Cancer Mother        breast  . Arthritis Father   . Hypertension Father   . Cancer Paternal Grandmother        breast    No Known Allergies  Current Outpatient Prescriptions on File Prior to Visit  Medication Sig Dispense Refill  . venlafaxine XR (EFFEXOR-XR) 150 MG 24 hr capsule Take 1 capsule (150 mg total) by mouth daily with breakfast. NEED OFFICE VISIT FOR FURTHER REFILLS 90 capsule 0   No current facility-administered medications on file prior to visit.     BP (!) 146/80   Pulse 64   Temp 98.1 F (36.7 C) (Oral)   Ht 5\' 4"  (1.626 m)   Wt 177 lb 1.9  oz (80.3 kg)   SpO2 95%   BMI 30.40 kg/m    Objective:   Physical Exam  Constitutional: She appears well-nourished.  Neck: Neck supple.  Cardiovascular: Normal rate and regular rhythm.   Pulmonary/Chest: Effort normal and breath sounds normal.  Musculoskeletal:       Left knee: She exhibits normal range of motion, no swelling, no erythema and no bony tenderness. Tenderness found. Medial joint line tenderness noted.  Negative Drawer and Lachman's tests. No instability. No swelling. Strength 4/5 to left lower knee with adduction against pressure.   Skin: Skin is warm and dry.          Assessment & Plan:  Acute Knee Pain:  Located to left knee along medial collateral ligament. Exam today with mild weakness, no swelling, ligamental tenderness. Start with plain films today, will likely need MRI. Consider Sports Medicine evaluation depending on  Xray result. Discussed to start wearing brace consistently for support, ice, elevation, NSAID's PRN.  Morrie Sheldonlark,Caliya Narine Kendal, NP

## 2017-07-12 LAB — COMPREHENSIVE METABOLIC PANEL
ALT: 14 U/L (ref 6–29)
AST: 15 U/L (ref 10–35)
Albumin: 4.2 g/dL (ref 3.6–5.1)
Alkaline Phosphatase: 81 U/L (ref 33–130)
BUN: 14 mg/dL (ref 7–25)
CALCIUM: 9.5 mg/dL (ref 8.6–10.4)
CHLORIDE: 94 mmol/L — AB (ref 98–110)
CO2: 21 mmol/L (ref 20–32)
Creat: 0.86 mg/dL (ref 0.50–1.05)
GLUCOSE: 85 mg/dL (ref 65–99)
Potassium: 4.7 mmol/L (ref 3.5–5.3)
Sodium: 128 mmol/L — ABNORMAL LOW (ref 135–146)
Total Bilirubin: 0.3 mg/dL (ref 0.2–1.2)
Total Protein: 7.5 g/dL (ref 6.1–8.1)

## 2017-07-12 LAB — HEMOGLOBIN A1C
HEMOGLOBIN A1C: 5.4 % (ref <5.7)
Mean Plasma Glucose: 108 mg/dL

## 2017-07-13 ENCOUNTER — Encounter: Payer: Self-pay | Admitting: Family Medicine

## 2017-07-13 ENCOUNTER — Ambulatory Visit (INDEPENDENT_AMBULATORY_CARE_PROVIDER_SITE_OTHER): Payer: BC Managed Care – PPO | Admitting: Family Medicine

## 2017-07-13 VITALS — BP 144/70 | HR 100 | Temp 98.2°F | Ht 64.0 in | Wt 178.0 lb

## 2017-07-13 DIAGNOSIS — M25562 Pain in left knee: Secondary | ICD-10-CM | POA: Diagnosis not present

## 2017-07-13 DIAGNOSIS — M2392 Unspecified internal derangement of left knee: Secondary | ICD-10-CM

## 2017-07-13 MED ORDER — METHYLPREDNISOLONE ACETATE 40 MG/ML IJ SUSP
80.0000 mg | Freq: Once | INTRAMUSCULAR | Status: AC
  Administered 2017-07-13: 80 mg via INTRA_ARTICULAR

## 2017-07-13 NOTE — Progress Notes (Signed)
Dr. Karleen Hampshire T. Ludella Pranger, MD, CAQ [facility] [address] [place], [address] Phone: [phone] Fax: [phone]  07/13/2017  Patient: Theresa Bridges, MRN: [id number], DOB: 15-Nov-1956, 61 y.o.  Primary Physician:  Doreene Nest, NP   Chief Complaint  Patient presents with  . Knee Pain    Left   Subjective:   Theresa Bridges is a 61 y.o. 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  Got pulled down a few times from her dog. Last day of EOG. That fridday, really had a lot of pain medially, then tripped over tennis ball. Waking her up at night. Feels like it is going to lock.  She has been having intermittent mechanical symptoms. She will have some clicking and popping in her knee, and she occasionally feels like the knee is going to give out. She currently does not have an effusion.  Can't do any type of swimming. At baseline she normally swims multiple times per week without any difficulty.  Past Medical History, Surgical History, Social History, Family History, Problem List, Medications, and Allergies have been reviewed and updated if relevant.  Patient Active Problem List   Diagnosis Date Noted  . Preventative health care 03/15/2016  . Essential hypertension 04/30/2015  . Depression 04/30/2015    Past Medical History:  Diagnosis Date  . Chicken pox   . Depression   . Hypertension   . Stress incontinence     Past Surgical History:  Procedure Laterality Date  . KNEE SURGERY    . ROTATOR CUFF REPAIR      Social History   Social History  . Marital status: Married    Spouse name: N/A  . Number of children: N/A  . Years of education: N/A   Occupational History  . Not on file.   Social History Main Topics  . Smoking status: Current Every Day Smoker    Packs/day: 0.20    Types: Cigarettes  . Smokeless tobacco: Never Used   . Alcohol use 0.0 oz/week     Comment: 1 to 2 glass of wine daily  . Drug use: No  . Sexual activity: Not on file   Other Topics Concern  . Not on file   Social History Narrative   Married.   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   Enjoys swimming, gardening, growing herbs, playing piano, reading.    Family History  Problem Relation Age of Onset  . Arthritis Mother   . Cancer Mother        breast  . Arthritis Father   . Hypertension Father   . Cancer Paternal Grandmother        breast    No Known Allergies  Medication list reviewed and updated in full in [facility].  GEN: No fevers, chills. Nontoxic. Primarily MSK c/o today. MSK: Detailed in the HPI GI: tolerating PO intake without difficulty Neuro: No numbness, parasthesias, or tingling associated. Otherwise the pertinent positives of the ROS are noted above.   Objective:   BP (!) 144/70   Pulse 100   Temp 98.2 F (36.8 C) (Oral)   Ht 5\' 4"  (1.626 m)   Wt 178 lb (80.7 kg)   BMI 30.55 kg/m    GEN: WDWN, NAD, Non-toxic, Alert & Oriented x 3 HEENT: Atraumatic, Normocephalic.  Ears and Nose: No external deformity. EXTR:  No clubbing/cyanosis/edema NEURO: Normal gait.  PSYCH: Normally interactive. Conversant. Not depressed or anxious appearing.  Calm demeanor.   Knee:  L Gait: Normal heel toe pattern ROM: 0-130 Effusion: neg Echymosis or edema: none Patellar tendon NT Painful PLICA: neg Patellar grind: negative Medial and lateral patellar facet loading: ttp medial and lateral joint lines: medial joint line pain Mcmurray's + for pain Bounce home + Flexion-pinch + Varus and valgus stress: stable Lachman: neg Ant and Post drawer: neg Hip abduction, IR, ER: WNL Hip flexion str: 5/5 Hip abd: 5/5 Quad: 5/5 VMO atrophy:No Hamstring concentric and eccentric: 5/5   Radiology: Dg Knee Complete 4 Views Left  Result Date: 07/11/2017 CLINICAL DATA:  61 year old female with acute knee pain adjacent to  collateral ligaments. Also weakness and popping 2 months ago. No reported injury. Initial encounter. EXAM: LEFT KNEE - COMPLETE 4+ VIEW COMPARISON:  None. FINDINGS: Thickening of the medial collateral ligament and slight infiltration of adjacent fat planes of indeterminate etiology. No fracture or dislocation. Very mild medial tibiofemoral joint space degenerative changes. Mild patellofemoral joint degenerative changes. Small joint effusion. IMPRESSION: Thickening of the medial collateral ligament and slight infiltration of adjacent fat planes of indeterminate etiology. If further delineation is clinically desired, MR imaging may be considered. No fracture or dislocation. Very mild medial tibiofemoral joint space degenerative changes. Mild patellofemoral joint degenerative changes. Small joint effusion. Electronically Signed   By: Lacy DuverneySteven  Olson M.D.   On: 07/11/2017 10:00    Assessment and Plan:   Acute pain of left knee - Plan: Ambulatory referral to Physical Therapy, methylPREDNISolone acetate (DEPO-MEDROL) injection 80 mg  Internal derangement of knee joint, left  She likely has sustained a medial meniscal tear. She does have some mechanical clicking and popping.  We are going to do a trial of some conservative care for 3 or 4 weeks. Continue with ice, NSAIDs, formal physical therapy, intra-articular injection.  I'm also going to have the patient wear a patellar J brace for stability when she is at work.  Knee Injection, L Patient verbally consented to procedure. Risks (including potential rare risk of infection), benefits, and alternatives explained. Sterilely prepped with Chloraprep. Ethyl cholride used for anesthesia. 8 cc Lidocaine 1% mixed with 2 mL Depo-Medrol 40 mg injected using the anteromedial approach without difficulty. No complications with procedure and tolerated well. Patient had decreased pain post-injection.   Follow-up: Return in about 4 weeks (around 08/10/2017).  Future  Appointments Date Time Provider Department Center  08/10/2017 12:00 PM Theresa Bridges, Karleen HampshireSpencer, MD [facility]    Meds ordered this encounter  Medications  . methylPREDNISolone acetate (DEPO-MEDROL) injection 80 mg   There are no discontinued medications. Orders Placed This Encounter  Procedures  . Ambulatory referral to Physical Therapy    Signed,  Karleen HampshireSpencer T. Theresa Doolen, MD   Allergies as of 07/13/2017   No Known Allergies     Medication List       Accurate as of 07/13/17 11:27 AM. Always use your most recent med list.          hydrochlorothiazide 25 MG tablet Commonly known as:  HYDRODIURIL Take 1 tablet (25 mg total) by mouth daily.   lisinopril 40 MG tablet Commonly known as:  PRINIVIL,ZESTRIL Take 1 tablet (40 mg total) by mouth daily.   venlafaxine XR 150 MG 24 hr capsule Commonly known as:  EFFEXOR-XR Take 1 capsule (150 mg total) by mouth daily with breakfast. NEED OFFICE VISIT FOR FURTHER REFILLS

## 2017-07-18 ENCOUNTER — Encounter: Payer: Self-pay | Admitting: *Deleted

## 2017-07-19 ENCOUNTER — Other Ambulatory Visit: Payer: Self-pay | Admitting: Internal Medicine

## 2017-07-19 ENCOUNTER — Other Ambulatory Visit: Payer: Self-pay | Admitting: Primary Care

## 2017-07-19 ENCOUNTER — Ambulatory Visit
Admission: RE | Admit: 2017-07-19 | Discharge: 2017-07-19 | Disposition: A | Discharge status: Home / Self Care | Payer: BC Managed Care – PPO | Source: Ambulatory Visit | Attending: Primary Care | Admitting: Primary Care

## 2017-07-19 DIAGNOSIS — Z1231 Encounter for screening mammogram for malignant neoplasm of breast: Secondary | ICD-10-CM | POA: Insufficient documentation

## 2017-07-19 DIAGNOSIS — Z1239 Encounter for other screening for malignant neoplasm of breast: Secondary | ICD-10-CM

## 2017-07-19 DIAGNOSIS — R928 Other abnormal and inconclusive findings on diagnostic imaging of breast: Secondary | ICD-10-CM | POA: Insufficient documentation

## 2017-07-19 DIAGNOSIS — N6489 Other specified disorders of breast: Secondary | ICD-10-CM

## 2017-07-21 ENCOUNTER — Other Ambulatory Visit: Payer: Self-pay | Admitting: Primary Care

## 2017-07-25 ENCOUNTER — Ambulatory Visit
Admission: RE | Admit: 2017-07-25 | Discharge: 2017-07-25 | Disposition: A | Discharge status: Home / Self Care | Payer: BC Managed Care – PPO | Source: Ambulatory Visit | Attending: Primary Care | Admitting: Primary Care

## 2017-07-25 DIAGNOSIS — R928 Other abnormal and inconclusive findings on diagnostic imaging of breast: Secondary | ICD-10-CM

## 2017-07-25 DIAGNOSIS — N6489 Other specified disorders of breast: Secondary | ICD-10-CM

## 2017-07-27 ENCOUNTER — Other Ambulatory Visit: Payer: Self-pay | Admitting: Primary Care

## 2017-08-02 ENCOUNTER — Encounter: Payer: Self-pay | Admitting: Primary Care

## 2017-08-02 DIAGNOSIS — I10 Essential (primary) hypertension: Secondary | ICD-10-CM

## 2017-08-03 MED ORDER — AMLODIPINE BESYLATE 10 MG PO TABS: 10.0000 mg | ORAL_TABLET | Freq: Every day | ORAL | 0 refills | Status: DC

## 2017-08-10 ENCOUNTER — Ambulatory Visit (INDEPENDENT_AMBULATORY_CARE_PROVIDER_SITE_OTHER): Payer: BC Managed Care – PPO | Admitting: Family Medicine

## 2017-08-10 ENCOUNTER — Ambulatory Visit: Payer: BC Managed Care – PPO | Admitting: Family Medicine

## 2017-08-10 ENCOUNTER — Encounter: Payer: Self-pay | Admitting: Family Medicine

## 2017-08-10 VITALS — BP 130/68 | HR 97 | Temp 98.6°F | Ht 64.0 in | Wt 177.2 lb

## 2017-08-10 DIAGNOSIS — M545 Low back pain, unspecified: Secondary | ICD-10-CM

## 2017-08-10 DIAGNOSIS — M2392 Unspecified internal derangement of left knee: Secondary | ICD-10-CM | POA: Diagnosis not present

## 2017-08-10 NOTE — Progress Notes (Signed)
Dr. Karleen Hampshire T. Mitcheal Sweetin, MD, CAQ Sports Medicine [facility] [address] [place], [address] Phone: [phone] Fax: [phone]  08/10/2017  Patient: Theresa Bridges, MRN: [id number], DOB: [birth year], 61 y.o.  Primary Physician:  Doreene Nest, NP   Chief Complaint  Patient presents with  . Follow-up    Left Knee Pain  . Back Pain    Radiates to left hip   Subjective:   Theresa Bridges is a 61 y.o. very pleasant female patient who presents with the following:  F/u L knee pain:  Not been having knee pain. I saw the patient about a month ago, and at that point thought she had probably a degenerative meniscal tear, and we treated her conservatively.  She went to physical therapy and has been doing rehabilitation at home.  Also place her in a knee brace, and we did an intra-articular steroid injection.  She has done quite well, this point her knee is not bothering her at all.  Now having some back problems.  PT, then doing HEP.   She is having some lower back problems more in the lower musculature as well as in the upper pelvic region on the left.  She is not having any groin pain and she is not having any radicular symptoms or any other neurological symptoms.  Past Medical History, Surgical History, Social History, Family History, Problem List, Medications, and Allergies have been reviewed and updated if relevant.  Patient Active Problem List   Diagnosis Date Noted  . Preventative health care 03/15/2016  . Essential hypertension 04/30/2015  . Depression 04/30/2015    Past Medical History:  Diagnosis Date  . Chicken pox   . Depression   . Hypertension   . Stress incontinence     Past Surgical History:  Procedure Laterality Date  . KNEE SURGERY    . ROTATOR CUFF REPAIR      Social History   Social History  . Marital status: Married    Spouse name: N/A  . Number of children: N/A  . Years of education: N/A   Occupational History    . Not on file.   Social History Main Topics  . Smoking status: Current Every Day Smoker    Packs/day: 0.20    Types: Cigarettes  . Smokeless tobacco: Never Used  . Alcohol use 0.0 oz/week     Comment: 1 to 2 glass of wine daily  . Drug use: No  . Sexual activity: Not on file   Other Topics Concern  . Not on file   Social History Narrative   Married.   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   Enjoys swimming, gardening, growing herbs, playing piano, reading.    Family History  Problem Relation Age of Onset  . Arthritis Mother   . Cancer Mother        breast  . Breast cancer Mother 66       70  . Arthritis Father   . Hypertension Father   . Cancer Paternal Grandmother        breast    No Known Allergies  Medication list reviewed and updated in full in [facility].  GEN: No fevers, chills. Nontoxic. Primarily MSK c/o today. MSK: Detailed in the HPI GI: tolerating PO intake without difficulty Neuro: No numbness, parasthesias, or tingling associated. Otherwise the pertinent positives of the ROS are noted above.   Objective:   BP 130/68  Pulse 97   Temp 98.6 F (37 C) (Oral)   Ht 5\' 4"  (1.626 m)   Wt 177 lb 4 oz (80.4 kg)   BMI 30.42 kg/m    GEN: WDWN, NAD, Non-toxic, Alert & Oriented x 3 HEENT: Atraumatic, Normocephalic.  Ears and Nose: No external deformity. EXTR: No clubbing/cyanosis/edema NEURO: Normal gait.  PSYCH: Normally interactive. Conversant. Not depressed or anxious appearing.  Calm demeanor.   Knee:  L Gait: Normal heel toe pattern ROM: 0-125 Effusion: neg Echymosis or edema: none Patellar tendon NT Painful PLICA: neg Patellar grind: negative Medial and lateral patellar facet loading: negative medial and lateral joint lines: minimal pain Mcmurray's neg Flexion-pinch neg Varus and valgus stress: stable Lachman: neg Ant and Post drawer: neg Hip abduction, IR, ER: WNL Hip flexion str: 5/5 Hip abd: 5/5 Quad: 5/5 VMO  atrophy:No Hamstring concentric and eccentric: 5/5   Range of motion at  the waist: Flexion: normal Extension: normal Lateral bending: normal Rotation: all normal  No echymosis or edema Rises to examination table with no difficulty Gait: non antalgic  Inspection/Deformity: N Paraspinus Tenderness: minimal l4-s1 ttp upper post pelvis on the l  B Ankle Dorsiflexion (L5,4): 5/5 B Great Toe Dorsiflexion (L5,4): 5/5 Heel Walk (L5): WNL Toe Walk (S1): WNL Rise/Squat (L4): WNL  SENSORY B Medial Foot (L4): WNL B Dorsum (L5): WNL B Lateral (S1): WNL Light Touch: WNL Pinprick: WNL  REFLEXES Knee (L4): 2+ Ankle (S1): 2+  B SLR, seated: neg B SLR, supine: neg B FABER: neg B Reverse FABER: neg B Greater Troch: NT B Log Roll: neg B Stork: NT B Sciatic Notch: NT   Radiology:  Assessment and Plan:   Internal derangement of knee joint, left  Acute left-sided low back pain without sciatica   She has done very well in regards to her knee pain.  I think that her back pain and pelvic pain is likely compensatory due to increased usage of hip stabilization.  I'm going to have her return to doing yoga routinely and get back in the pool for exercise.  Continue with knee home exercise program.  I expect that she will do well.  Follow-up: prn  Signed,  Yedidya Duddy T. Niki Payment, MD   Patient's Medications  New Prescriptions   No medications on file  Previous Medications   AMLODIPINE (NORVASC) 10 MG TABLET    Take 1 tablet (10 mg total) by mouth daily.   LISINOPRIL (PRINIVIL,ZESTRIL) 40 MG TABLET    Take 1 tablet (40 mg total) by mouth daily.   VENLAFAXINE XR (EFFEXOR-XR) 150 MG 24 HR CAPSULE    Take 1 capsule (150 mg total) by mouth daily with breakfast.  Modified Medications   No medications on file  Discontinued Medications   No medications on file

## 2017-08-17 ENCOUNTER — Telehealth: Payer: Self-pay | Admitting: Primary Care

## 2017-08-17 NOTE — Telephone Encounter (Addendum)
-----   Message from Doreene NestKatherine K Ahmeer Tuman, NP sent at 08/03/2017  5:55 PM EDT ----- Regarding: BP What's her BP running since we switched her from HCTZ to amlodipine.

## 2017-08-17 NOTE — Telephone Encounter (Signed)
Per DPR, left detail message of Kate's comments for patient to call back. 

## 2017-08-19 NOTE — Telephone Encounter (Deleted)
Will send MyChart message

## 2017-08-19 NOTE — Telephone Encounter (Signed)
Per DPR, left detail message of Kate's comments for patient to call back. 

## 2017-08-29 ENCOUNTER — Other Ambulatory Visit: Payer: Self-pay | Admitting: Primary Care

## 2017-08-29 DIAGNOSIS — I10 Essential (primary) hypertension: Secondary | ICD-10-CM

## 2017-09-02 ENCOUNTER — Encounter: Payer: Self-pay | Admitting: Primary Care

## 2017-09-02 DIAGNOSIS — I10 Essential (primary) hypertension: Secondary | ICD-10-CM

## 2017-09-05 MED ORDER — AMLODIPINE BESYLATE 10 MG PO TABS: 10.0000 mg | ORAL_TABLET | Freq: Every day | ORAL | 1 refills | Status: DC

## 2017-09-12 ENCOUNTER — Ambulatory Visit (INDEPENDENT_AMBULATORY_CARE_PROVIDER_SITE_OTHER)
Admission: RE | Admit: 2017-09-12 | Discharge: 2017-09-12 | Disposition: A | Discharge status: Home / Self Care | Payer: BC Managed Care – PPO | Source: Ambulatory Visit | Attending: Primary Care | Admitting: Primary Care

## 2017-09-12 ENCOUNTER — Ambulatory Visit (INDEPENDENT_AMBULATORY_CARE_PROVIDER_SITE_OTHER): Payer: BC Managed Care – PPO | Admitting: Primary Care

## 2017-09-12 ENCOUNTER — Encounter: Payer: Self-pay | Admitting: Primary Care

## 2017-09-12 VITALS — BP 140/78 | HR 97 | Temp 98.0°F | Ht 64.0 in | Wt 177.8 lb

## 2017-09-12 DIAGNOSIS — M5442 Lumbago with sciatica, left side: Secondary | ICD-10-CM

## 2017-09-12 MED ORDER — PREDNISONE 20 MG PO TABS: ORAL_TABLET | ORAL | 0 refills | Status: DC

## 2017-09-12 MED ORDER — METHOCARBAMOL 500 MG PO TABS: 500.0000 mg | ORAL_TABLET | Freq: Three times a day (TID) | ORAL | 0 refills | Status: DC | PRN

## 2017-09-12 NOTE — Patient Instructions (Signed)
Complete xray(s) prior to leaving today. I will notify you of your results once received.  Start prednisone tablets. Take three tablets for 2 days, then two tablets for 2 days, then one tablet for 3 days.  You may take methocarbamol every 8 hours as needed for muscle spasms.  Do not take motrin while taking prednisone.  Please notify me if no improvement in 5-6 days.  It was a pleasure to see you today!   Back Exercises The following exercises strengthen the muscles that help to support the back. They also help to keep the lower back flexible. Doing these exercises can help to prevent back pain or lessen existing pain. If you have back pain or discomfort, try doing these exercises 2-3 times each day or as told by your health care provider. When the pain goes away, do them once each day, but increase the number of times that you repeat the steps for each exercise (do more repetitions). If you do not have back pain or discomfort, do these exercises once each day or as told by your health care provider. Exercises Single Knee to Chest  Repeat these steps 3-5 times for each leg: 1. Lie on your back on a firm bed or the floor with your legs extended. 2. Bring one knee to your chest. Your other leg should stay extended and in contact with the floor. 3. Hold your knee in place by grabbing your knee or thigh. 4. Pull on your knee until you feel a gentle stretch in your lower back. 5. Hold the stretch for 10-30 seconds. 6. Slowly release and straighten your leg.  Pelvic Tilt  Repeat these steps 5-10 times: 1. Lie on your back on a firm bed or the floor with your legs extended. 2. Bend your knees so they are pointing toward the ceiling and your feet are flat on the floor. 3. Tighten your lower abdominal muscles to press your lower back against the floor. This motion will tilt your pelvis so your tailbone points up toward the ceiling instead of pointing to your feet or the floor. 4. With gentle  tension and even breathing, hold this position for 5-10 seconds.  Cat-Cow  Repeat these steps until your lower back becomes more flexible: 1. Get into a hands-and-knees position on a firm surface. Keep your hands under your shoulders, and keep your knees under your hips. You may place padding under your knees for comfort. 2. Let your head hang down, and point your tailbone toward the floor so your lower back becomes rounded like the back of a cat. 3. Hold this position for 5 seconds. 4. Slowly lift your head and point your tailbone up toward the ceiling so your back forms a sagging arch like the back of a cow. 5. Hold this position for 5 seconds.  Press-Ups  Repeat these steps 5-10 times: 1. Lie on your abdomen (face-down) on the floor. 2. Place your palms near your head, about shoulder-width apart. 3. While you keep your back as relaxed as possible and keep your hips on the floor, slowly straighten your arms to raise the top half of your body and lift your shoulders. Do not use your back muscles to raise your upper torso. You may adjust the placement of your hands to make yourself more comfortable. 4. Hold this position for 5 seconds while you keep your back relaxed. 5. Slowly return to lying flat on the floor.  Bridges  Repeat these steps 10 times: 1. Lie on  your back on a firm surface. 2. Bend your knees so they are pointing toward the ceiling and your feet are flat on the floor. 3. Tighten your buttocks muscles and lift your buttocks off of the floor until your waist is at almost the same height as your knees. You should feel the muscles working in your buttocks and the back of your thighs. If you do not feel these muscles, slide your feet 1-2 inches farther away from your buttocks. 4. Hold this position for 3-5 seconds. 5. Slowly lower your hips to the starting position, and allow your buttocks muscles to relax completely.  If this exercise is too easy, try doing it with your arms  crossed over your chest. Abdominal Crunches  Repeat these steps 5-10 times: 1. Lie on your back on a firm bed or the floor with your legs extended. 2. Bend your knees so they are pointing toward the ceiling and your feet are flat on the floor. 3. Cross your arms over your chest. 4. Tip your chin slightly toward your chest without bending your neck. 5. Tighten your abdominal muscles and slowly raise your trunk (torso) high enough to lift your shoulder blades a tiny bit off of the floor. Avoid raising your torso higher than that, because it can put too much stress on your low back and it does not help to strengthen your abdominal muscles. 6. Slowly return to your starting position.  Back Lifts Repeat these steps 5-10 times: 1. Lie on your abdomen (face-down) with your arms at your sides, and rest your forehead on the floor. 2. Tighten the muscles in your legs and your buttocks. 3. Slowly lift your chest off of the floor while you keep your hips pressed to the floor. Keep the back of your head in line with the curve in your back. Your eyes should be looking at the floor. 4. Hold this position for 3-5 seconds. 5. Slowly return to your starting position.  Contact a health care provider if:  Your back pain or discomfort gets much worse when you do an exercise.  Your back pain or discomfort does not lessen within 2 hours after you exercise. If you have any of these problems, stop doing these exercises right away. Do not do them again unless your health care provider says that you can. Get help right away if:  You develop sudden, severe back pain. If this happens, stop doing the exercises right away. Do not do them again unless your health care provider says that you can. This information is not intended to replace advice given to you by your health care provider. Make sure you discuss any questions you have with your health care provider. Document Released: 12/30/2004 Document Revised:  03/31/2016 Document Reviewed: 01/16/2015 Elsevier Interactive Patient Education  2017 [employer].

## 2017-09-12 NOTE — Progress Notes (Signed)
Subjective:    Patient ID: Theresa Bridges, female    DOB: Apr 09, 1956, 61 y.o.   MRN: [id number]  HPI  Theresa Bridges is a 61 year old female who presents today with a chief complaint of back pain. Her pain is located to the left lower back with radiation to her left hip.   She was last evaluated on 08/10/17 by Sports Medicine for follow up of chronic knee pain and also for a chief complaint of acute low back pain. Her back pain was presumed to be secondary to over compensation to her hip from chronic knee pain. She was encouraged to return to yoga and pool for exercise.   Since her visit on 08/10/17 she's doing worse. 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 She describes her pain as constant ache with intermittent "rushing" feeling. She's had numbness/tingling to the same location. She's not noticed any difference in pain after yoga and water exercise. She's been taking Motrin 200 mg once to twice daily without much improvement. Denies numbness to her groin, loss of bowel/bladder control.   Review of Systems  Musculoskeletal: Positive for back pain.  Skin: Negative for color change.  Neurological: Positive for numbness. Negative for weakness.       Past Medical History:  Diagnosis Date  . Chicken pox   . Depression   . Hypertension   . Stress incontinence      Social History   Social History  . Marital status: Married    Spouse name: N/A  . Number of children: N/A  . Years of education: N/A   Occupational History  . Not on file.   Social History Main Topics  . Smoking status: Current Every Day Smoker    Packs/day: 0.20    Types: Cigarettes  . Smokeless tobacco: Never Used  . Alcohol use 0.0 oz/week     Comment: 1 to 2 glass of wine daily  . Drug use: No  . Sexual activity: Not on file   Other Topics Concern  . Not on file   Social History Narrative   Married.   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   Enjoys swimming, gardening, growing herbs, playing  piano, reading.    Past Surgical History:  Procedure Laterality Date  . KNEE SURGERY    . ROTATOR CUFF REPAIR      Family History  Problem Relation Age of Onset  . Arthritis Mother   . Cancer Mother        breast  . Breast cancer Mother 47       63  . Arthritis Father   . Hypertension Father   . Cancer Paternal Grandmother        breast    No Known Allergies  Current Outpatient Prescriptions on File Prior to Visit  Medication Sig Dispense Refill  . amLODipine (NORVASC) 10 MG tablet Take 1 tablet (10 mg total) by mouth daily. 90 tablet 1  . lisinopril (PRINIVIL,ZESTRIL) 40 MG tablet Take 1 tablet (40 mg total) by mouth daily. 30 tablet 3  . venlafaxine XR (EFFEXOR-XR) 150 MG 24 hr capsule Take 1 capsule (150 mg total) by mouth daily with breakfast. 90 capsule 0   No current facility-administered medications on file prior to visit.     BP 140/78   Pulse 97   Temp 98 F (36.7 C) (Oral)   Ht  (1.626 m)   Wt 177 lb 12.8 oz (80.6 kg)  SpO2 97%   BMI 30.52 kg/m    Objective:   Physical Exam  Neck: Neck supple.  Cardiovascular: Normal rate and regular rhythm.   Pulmonary/Chest: Effort normal and breath sounds normal.  Musculoskeletal:       Lumbar back: She exhibits decreased range of motion and pain. She exhibits no tenderness.       Back:  5/5 strength to bilateral lower extremities.  Skin: Skin is warm and dry.          Assessment & Plan:  Acute low back pain:  Located to left side since early September. Exam today with high suspicion for nerve irritation. Check plain films, consider MRI in the future if needed. Rx for prednisone course and Robaxin sent to pharmacy. PT offered but patient refuses as she cannot afford. Back exercises provided today. Will have her follow up with Sports Medicine if no improvement.  Morrie Sheldon, NP

## 2017-09-18 ENCOUNTER — Encounter: Payer: Self-pay | Admitting: Primary Care

## 2017-09-19 NOTE — Telephone Encounter (Signed)
Starkes, do you know anything about send patients to PT at the [facility]?

## 2017-09-21 ENCOUNTER — Encounter: Payer: Self-pay | Admitting: Primary Care

## 2017-09-21 DIAGNOSIS — M545 Low back pain: Secondary | ICD-10-CM

## 2017-09-22 MED ORDER — DICLOFENAC SODIUM 75 MG PO TBEC: 75.0000 mg | DELAYED_RELEASE_TABLET | Freq: Two times a day (BID) | ORAL | 1 refills | Status: DC | PRN

## 2017-09-30 ENCOUNTER — Encounter: Payer: Self-pay | Admitting: Primary Care

## 2017-10-10 ENCOUNTER — Encounter: Payer: Self-pay | Admitting: Family Medicine

## 2017-10-10 ENCOUNTER — Ambulatory Visit: Payer: BC Managed Care – PPO | Admitting: Family Medicine

## 2017-10-10 VITALS — BP 130/60 | HR 97 | Temp 98.2°F | Ht 64.0 in | Wt 174.8 lb

## 2017-10-10 DIAGNOSIS — M5136 Other intervertebral disc degeneration, lumbar region: Secondary | ICD-10-CM

## 2017-10-10 DIAGNOSIS — M5442 Lumbago with sciatica, left side: Secondary | ICD-10-CM | POA: Diagnosis not present

## 2017-10-10 MED ORDER — PREDNISONE 20 MG PO TABS
ORAL_TABLET | ORAL | 0 refills | Status: DC
Start: 2017-10-10 — End: 2018-06-07

## 2017-10-10 NOTE — Progress Notes (Signed)
Dr. Karleen HampshireSpencer T. Seraj Dunnam, MD, CAQ [facility] [address] [place], [id number] Phone: [phone] Fax: [phone]  10/10/2017  Patient: Theresa SpillerMari B Nasca, MRN: [id number], DOB: 04-24-1956, 61 y.o.  Primary Physician:  Doreene Nestlark, Katherine K, NP   Chief Complaint  Patient presents with  . Back Pain    Mid Low Back x 1 month-seen K. Clark 09/12/17   Subjective:   Theresa Bridges is a 61 y.o. very pleasant female patient who presents with the following:  L LBP x 1 month or more. Mostly L LBP. Can walk to the cop room, then has to stop in the one spot and pain in the buttocks. Doing 3 different rehab exercises. Some numbness and tingling in the L side. Feels different on the L side.   I saw the patient and myself about 2 months ago primarily for knee pain, and at this point her knee pain is completely resolved.  She was having some very mild back pain at that point, this is persistently gotten worse since that time.  Now she has pain in the left lower aspect in the back and in the buttocks.  Rare tingling and numbness in the left side in the thigh.  Past Medical History, Surgical History, Social History, Family History, Problem List, Medications, and Allergies have been reviewed and updated if relevant.  Patient Active Problem List   Diagnosis Date Noted  . Preventative health care 03/15/2016  . Essential hypertension 04/30/2015  . Depression 04/30/2015    Past Medical History:  Diagnosis Date  . Chicken pox   . Depression   . Hypertension   . Stress incontinence     Past Surgical History:  Procedure Laterality Date  . KNEE SURGERY    . ROTATOR CUFF REPAIR      Social History   Socioeconomic History  . Marital status: Married    Spouse name: Not on file  . Number of children: Not on file  . Years of education: Not on file  . Highest education level: Not on file  Social Needs  . Financial resource strain: Not on file  . Food  insecurity - worry: Not on file  . Food insecurity - inability: Not on file  . Transportation needs - medical: Not on file  . Transportation needs - non-medical: Not on file  Occupational History  . Not on file  Tobacco Use  . Smoking status: Current Every Day Smoker    Packs/day: 0.20    Types: Cigarettes  . Smokeless tobacco: Never Used  Substance and Sexual Activity  . Alcohol use: Yes    Alcohol/week: 0.0 oz    Comment: 1 to 2 glass of wine daily  . Drug use: No  . Sexual activity: Not on file  Other Topics Concern  . Not on file  Social History Narrative   Married.   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   Enjoys swimming, gardening, growing herbs, playing piano, reading.    Family History  Problem Relation Age of Onset  . Arthritis Mother   . Cancer Mother        breast  . Breast cancer Mother [phone]       [id number]  . Arthritis Father   . Hypertension Father   . Cancer Paternal Grandmother        breast    No Known Allergies  Medication list reviewed and updated in full in [facility].  GEN: no acute illness or fever CV: No chest pain or shortness of breath MSK: detailed above Neuro: neurological signs are described above ROS O/w per HPI  Objective:   BP 130/60   Pulse 97   Temp 98.2 F (36.8 C) (Oral)   Ht 5\' 4"  (1.626 m)   Wt 174 lb 12 oz (79.3 kg)   BMI 30.00 kg/m    GEN: Well-developed,well-nourished,in no acute distress; alert,appropriate and cooperative throughout examination HEENT: Normocephalic and atraumatic without obvious abnormalities. Ears, externally no deformities PULM: Breathing comfortably in no respiratory distress EXT: No clubbing, cyanosis, or edema PSYCH: Normally interactive. Cooperative during the interview. Pleasant. Friendly and conversant. Not anxious or depressed appearing. Normal, full affect.  Range of motion at  the waist: Flexion, extension, lateral bending and rotation: full  No echymosis or edema Rises to examination  table with mild difficulty Gait: minimally antalgic  Inspection/Deformity: N Paraspinus Tenderness: mild ttp l4-s1  B Ankle Dorsiflexion (L5,4): 5/5 B Great Toe Dorsiflexion (L5,4): 5/5 Heel Walk (L5): WNL Toe Walk (S1): WNL Rise/Squat (L4): WNL, mild pain  SENSORY B Medial Foot (L4): WNL B Dorsum (L5): WNL B Lateral (S1): WNL Light Touch: WNL Pinprick: WNL Mild decreased sensation gross touch to thigh  REFLEXES Knee (L4): 2+ Ankle (S1): 2+  B SLR, seated: neg B SLR, supine: neg B FABER: neg B Reverse FABER: neg B Greater Troch: NT B Log Roll: neg B Stork: NT B Sciatic Notch: ttp on the L  Radiology: Dg Lumbar Spine Complete  Result Date: 09/13/2017 CLINICAL DATA:  Acute onset of low back pain with left lower extremity radicular symptoms 1 month ago. No known trauma. EXAM: LUMBAR SPINE - COMPLETE 4+ VIEW COMPARISON:  None in PACs FINDINGS: The lumbar vertebral bodies are preserved in height. There is curvature convex toward the right involving the thoracolumbar junction. The pedicles are intact. The transverse processes are also intact. The oblique views reveal facet joint hypertrophy at L4-5 and L5-S1. There is moderate disc space narrowing at L2-3, L3-4, L4-5, and L5-S1. There is no spondylolisthesis. The observed portions of the sacrum are normal. There is calcification in the wall of the abdominal aorta. The colonic stool burden is moderately increased. IMPRESSION: There is no compression fracture or spondylolisthesis. There is moderate multilevel degenerative disc space narrowing from L2-3 through L5-S1. There is osteoarthritic facet joint hypertrophy at L4-5 and at L5-S1. Moderately increase colonic stool burden could reflect constipation in the appropriate clinical setting. Abdominal aortic atherosclerosis. Electronically Signed   By: David  [place] M.D.   On: 09/13/2017 09:54    Assessment and Plan:   DDD (degenerative disc disease), lumbar  Acute left-sided low back  pain with left-sided sciatica  Significant degenerative changes in the back, likely nerve encroachment, disc versus bony encroachment.  14 days prednisone, continue with rehabilitation exercises, working out in the pool.  Follow-up: Return in about 6 weeks (around 11/21/2017).  Future Appointments  Date Time Provider Department Center  11/21/2017  2:15 PM Johndavid Geralds, Karleen Hampshire, MD [facility]    Meds ordered this encounter  Medications  . predniSONE (DELTASONE) 20 MG tablet    Sig: 2 tabs po for 7 days, then 1 tab for 7 days    Dispense:  21 tablet    Refill:  0   Signed,  Fidela Cieslak T. Antavious Spanos, MD   Allergies as of 10/10/2017   No Known Allergies     Medication List        Accurate as of 10/10/17  11:59 PM. Always use your most recent med list.          amLODipine 10 MG tablet Commonly known as:  NORVASC Take 1 tablet (10 mg total) by mouth daily.   diclofenac 75 MG EC tablet Commonly known as:  VOLTAREN Take 1 tablet (75 mg total) by mouth 2 (two) times daily as needed for moderate pain.   lisinopril 40 MG tablet Commonly known as:  PRINIVIL,ZESTRIL Take 1 tablet (40 mg total) by mouth daily.   methocarbamol 500 MG tablet Commonly known as:  ROBAXIN Take 1 tablet (500 mg total) by mouth every 8 (eight) hours as needed for muscle spasms.   predniSONE 20 MG tablet Commonly known as:  DELTASONE 2 tabs po for 7 days, then 1 tab for 7 days   venlafaxine XR 150 MG 24 hr capsule Commonly known as:  EFFEXOR-XR Take 1 capsule (150 mg total) by mouth daily with breakfast.

## 2017-10-25 ENCOUNTER — Other Ambulatory Visit: Payer: Self-pay | Admitting: Primary Care

## 2017-10-25 DIAGNOSIS — I10 Essential (primary) hypertension: Secondary | ICD-10-CM

## 2017-11-21 ENCOUNTER — Ambulatory Visit: Payer: BC Managed Care – PPO | Admitting: Family Medicine

## 2018-01-22 ENCOUNTER — Other Ambulatory Visit: Payer: Self-pay | Admitting: Primary Care

## 2018-04-09 ENCOUNTER — Encounter: Payer: Self-pay | Admitting: Primary Care

## 2018-04-09 DIAGNOSIS — I10 Essential (primary) hypertension: Secondary | ICD-10-CM

## 2018-04-09 DIAGNOSIS — F330 Major depressive disorder, recurrent, mild: Secondary | ICD-10-CM

## 2018-04-10 MED ORDER — LISINOPRIL 40 MG PO TABS: 40.0000 mg | ORAL_TABLET | Freq: Every day | ORAL | 1 refills | Status: DC

## 2018-04-10 MED ORDER — AMLODIPINE BESYLATE 10 MG PO TABS: 10.0000 mg | ORAL_TABLET | Freq: Every day | ORAL | 1 refills | Status: DC

## 2018-04-10 MED ORDER — VENLAFAXINE HCL ER 150 MG PO CP24: 150.0000 mg | ORAL_CAPSULE | Freq: Every day | ORAL | 1 refills | Status: DC

## 2018-06-04 ENCOUNTER — Encounter: Payer: Self-pay | Admitting: Primary Care

## 2018-06-07 ENCOUNTER — Encounter: Payer: Self-pay | Admitting: Family Medicine

## 2018-06-07 ENCOUNTER — Ambulatory Visit: Payer: BC Managed Care – PPO | Admitting: Family Medicine

## 2018-06-07 VITALS — BP 144/68 | HR 102 | Temp 98.3°F | Ht 64.0 in | Wt 175.0 lb

## 2018-06-07 DIAGNOSIS — N644 Mastodynia: Secondary | ICD-10-CM | POA: Diagnosis not present

## 2018-06-07 DIAGNOSIS — N649 Disorder of breast, unspecified: Secondary | ICD-10-CM | POA: Diagnosis not present

## 2018-06-07 DIAGNOSIS — L988 Other specified disorders of the skin and subcutaneous tissue: Secondary | ICD-10-CM

## 2018-06-07 NOTE — Progress Notes (Signed)
Subjective:    Patient ID: Theresa SpillerMari B Ganoe, female    DOB: 1956-02-09, 62 y.o.   MRN: [id number]  HPI This is a 62 yo female who presents today with right breast pain x 4 days.  She woke up 4 days ago with dark spot on right nipple, area was red and painful. No discharge from nipple. Developed two bumps on side of right breast the next day. Feels less painful and looks less angry today. No additional rash, no drainage. Prior to noticing irritation she was doing a lot of spray painting outside with her right arm. May have gotten bitten by a bug.  Last mammogram 8/18- nml. Scheduled for screening mammo next month. No new soaps, lotions, detergents or shampoos. Has otherwise been feeling fine, no myalgias or fever. No prodromal sensations of skin.  Mother had breast cancer x 2.   Past Medical History:  Diagnosis Date  . Chicken pox   . Depression   . Hypertension   . Stress incontinence    Past Surgical History:  Procedure Laterality Date  . KNEE SURGERY    . ROTATOR CUFF REPAIR     Family History  Problem Relation Age of Onset  . Arthritis Mother   . Cancer Mother        breast  . Breast cancer Mother [phone]       [phone]  . Arthritis Father   . Hypertension Father   . Cancer Paternal Grandmother        breast   Social History   Tobacco Use  . Smoking status: Current Every Day Smoker    Packs/day: 0.20    Types: Cigarettes  . Smokeless tobacco: Never Used  Substance Use Topics  . Alcohol use: Yes    Alcohol/week: 0.0 oz    Comment: 1 to 2 glass of wine daily  . Drug use: No      Review of Systems Per HPI    Objective:   Physical Exam  Constitutional: She appears well-developed and well-nourished.  HENT:  Head: Normocephalic and atraumatic.  Eyes: Conjunctivae are normal.  Cardiovascular: Normal rate.  Pulmonary/Chest: Effort normal. Right breast exhibits skin change and tenderness. Right breast exhibits no inverted nipple, no mass and no nipple discharge. Left breast  exhibits no inverted nipple, no mass, no nipple discharge, no skin change and no tenderness.  Right nipple mildly erythematous with small central scab. No discharge.  Medial breast in line with nipple with two resolving vesicles. No additional rash on side or back.   Skin: Skin is warm and dry.  Psychiatric: She has a normal mood and affect. Her behavior is normal. Judgment and thought content normal.  Vitals reviewed.     BP (!) 144/68 (BP Location: Right Arm, Patient Position: Sitting, Cuff Size: Large)   Pulse (!) 102   Temp 98.3 F (36.8 C) (Oral)   Ht 5\' 4"  (1.626 m)   Wt 175 lb (79.4 kg)   SpO2 97%   BMI 30.04 kg/m  Wt Readings from Last 3 Encounters:  06/07/18 175 lb (79.4 kg)  10/10/17 174 lb 12 oz (79.3 kg)  09/12/17 177 lb 12.8 oz (80.6 kg)       Assessment & Plan:  1. Skin lesion of breast - resolving, unclear etiology - no mass, continue to monitor - she was instructed to keep area clean and dry - RTC if any worsening - mammogram next month as scheduled, can move to sooner if skin lesions/nipple irritation do  not resolve completely within a week  2. Nipple pain - improved, local irritation - RTC precautions reviewed   Olean Ree, FNP-BC  [facility], [facility]  06/09/2018 1:52 PM

## 2018-06-07 NOTE — Patient Instructions (Signed)
Good to see you today  I think your rash and nipple irritation are from some contact/abraisison or insect. Continue to keep clean and dry, go without a bra if more comfortable  If not continuing to get better or if any concerns, please let me know

## 2018-06-09 ENCOUNTER — Encounter: Payer: Self-pay | Admitting: Family Medicine

## 2018-06-10 ENCOUNTER — Other Ambulatory Visit: Payer: Self-pay | Admitting: Primary Care

## 2018-06-10 DIAGNOSIS — F330 Major depressive disorder, recurrent, mild: Secondary | ICD-10-CM

## 2018-07-05 ENCOUNTER — Other Ambulatory Visit: Payer: Self-pay | Admitting: Primary Care

## 2018-07-11 ENCOUNTER — Other Ambulatory Visit: Payer: Self-pay | Admitting: Primary Care

## 2018-07-11 DIAGNOSIS — F330 Major depressive disorder, recurrent, mild: Secondary | ICD-10-CM

## 2018-07-11 DIAGNOSIS — I10 Essential (primary) hypertension: Secondary | ICD-10-CM

## 2018-07-18 DIAGNOSIS — I10 Essential (primary) hypertension: Secondary | ICD-10-CM

## 2018-07-19 ENCOUNTER — Other Ambulatory Visit: Payer: Self-pay | Admitting: Primary Care

## 2018-07-19 DIAGNOSIS — Z1239 Encounter for other screening for malignant neoplasm of breast: Secondary | ICD-10-CM

## 2018-07-19 MED ORDER — AMLODIPINE BESYLATE 10 MG PO TABS: 10.0000 mg | ORAL_TABLET | Freq: Every day | ORAL | 0 refills | Status: DC

## 2018-07-19 NOTE — Telephone Encounter (Signed)
Theresa Bridges, will you please get her in for CPE asap? It's okay if it's back to back with another CPE, new patient appointment. Any slot. Thanks!

## 2018-08-08 ENCOUNTER — Ambulatory Visit
Admission: RE | Admit: 2018-08-08 | Discharge: 2018-08-08 | Disposition: A | Discharge status: Home / Self Care | Payer: BC Managed Care – PPO | Source: Ambulatory Visit | Attending: Primary Care | Admitting: Primary Care

## 2018-08-08 DIAGNOSIS — Z1239 Encounter for other screening for malignant neoplasm of breast: Secondary | ICD-10-CM

## 2018-08-08 DIAGNOSIS — Z1231 Encounter for screening mammogram for malignant neoplasm of breast: Secondary | ICD-10-CM | POA: Insufficient documentation

## 2018-08-11 ENCOUNTER — Other Ambulatory Visit: Payer: Self-pay | Admitting: Primary Care

## 2018-08-11 DIAGNOSIS — I10 Essential (primary) hypertension: Secondary | ICD-10-CM

## 2018-08-11 DIAGNOSIS — F330 Major depressive disorder, recurrent, mild: Secondary | ICD-10-CM

## 2018-08-17 ENCOUNTER — Other Ambulatory Visit: Payer: Self-pay | Admitting: Primary Care

## 2018-08-17 DIAGNOSIS — F330 Major depressive disorder, recurrent, mild: Secondary | ICD-10-CM

## 2018-08-17 DIAGNOSIS — I10 Essential (primary) hypertension: Secondary | ICD-10-CM

## 2018-08-17 NOTE — Telephone Encounter (Signed)
Theresa Bridges, will you get her scheduled as soon as possible? Ok if back to back with NP, CPE, etc. Thanks!

## 2018-08-18 DIAGNOSIS — F330 Major depressive disorder, recurrent, mild: Secondary | ICD-10-CM

## 2018-08-18 DIAGNOSIS — I10 Essential (primary) hypertension: Secondary | ICD-10-CM

## 2018-08-20 MED ORDER — LISINOPRIL 40 MG PO TABS: ORAL_TABLET | ORAL | 0 refills | Status: DC

## 2018-08-20 MED ORDER — VENLAFAXINE HCL ER 150 MG PO CP24: ORAL_CAPSULE | ORAL | 0 refills | Status: DC

## 2018-08-20 MED ORDER — AMLODIPINE BESYLATE 10 MG PO TABS: 10.0000 mg | ORAL_TABLET | Freq: Every day | ORAL | 0 refills | Status: DC

## 2018-08-20 NOTE — Telephone Encounter (Signed)
Theresa Bridges Theresa Bridges, will you please call her ASAP on Monday the 16th?

## 2018-09-07 ENCOUNTER — Ambulatory Visit: Payer: BC Managed Care – PPO | Admitting: Primary Care

## 2018-09-29 ENCOUNTER — Encounter: Payer: Self-pay | Admitting: Primary Care

## 2018-09-29 ENCOUNTER — Ambulatory Visit (INDEPENDENT_AMBULATORY_CARE_PROVIDER_SITE_OTHER): Payer: BC Managed Care – PPO | Admitting: Primary Care

## 2018-09-29 VITALS — BP 136/70 | HR 104 | Temp 98.2°F | Ht 64.0 in | Wt 174.0 lb

## 2018-09-29 DIAGNOSIS — Z0000 Encounter for general adult medical examination without abnormal findings: Secondary | ICD-10-CM | POA: Diagnosis not present

## 2018-09-29 DIAGNOSIS — I10 Essential (primary) hypertension: Secondary | ICD-10-CM | POA: Diagnosis not present

## 2018-09-29 DIAGNOSIS — F3342 Major depressive disorder, recurrent, in full remission: Secondary | ICD-10-CM

## 2018-09-29 LAB — LIPID PANEL
CHOL/HDL RATIO: 3
CHOLESTEROL: 213 mg/dL — AB (ref 0–200)
HDL: 60.8 mg/dL (ref >39.00)
LDL Cholesterol: 123 mg/dL — ABNORMAL HIGH (ref 0–99)
NonHDL: 151.87
TRIGLYCERIDES: 145 mg/dL (ref 0.0–149.0)
VLDL: 29 mg/dL (ref 0.0–40.0)

## 2018-09-29 LAB — COMPREHENSIVE METABOLIC PANEL
ALBUMIN: 4.6 g/dL (ref 3.5–5.2)
ALT: 15 U/L (ref 0–35)
AST: 15 U/L (ref 0–37)
Alkaline Phosphatase: 84 U/L (ref 39–117)
BILIRUBIN TOTAL: 0.4 mg/dL (ref 0.2–1.2)
BUN: 17 mg/dL (ref 6–23)
CHLORIDE: 99 meq/L (ref 96–112)
CO2: 26 meq/L (ref 19–32)
CREATININE: 0.95 mg/dL (ref 0.40–1.20)
Calcium: 9.4 mg/dL (ref 8.4–10.5)
GFR: 63.63 mL/min (ref >60.00)
Glucose, Bld: 89 mg/dL (ref 70–99)
Potassium: 4.1 mEq/L (ref 3.5–5.1)
SODIUM: 134 meq/L — AB (ref 135–145)
Total Protein: 7.8 g/dL (ref 6.0–8.3)

## 2018-09-29 NOTE — Progress Notes (Signed)
Subjective:    Patient ID: Theresa Bridges, female    DOB: 31-May-1956, 62 y.o.   MRN: [id number]  HPI  Theresa Bridges is a 62 year old female who presents today for complete physical.  Immunizations: -Tetanus: Completed in 2017 -Influenza: Completed on 09/15/18   Diet: She endorses a healthy diet. Breakfast: Yogurt, [employer] bars, oatmeal; restaurants Lunch: Salad, soup, sandwich, baked potato   Dinner: Skips mostly, popcorn  Snacks: Popcorn, trail mix Desserts: None Beverages: Water, wine   Exercise: She is not exercising Eye exam: Completed 2 years ago Dental exam: Completed years ago.  Colonoscopy: Never completed, opts for cologuard Pap Smear: Completed in 2017 Mammogram: Completed in September 2019 Hep C Screen: Due  BP Readings from Last 3 Encounters:  09/29/18 136/70  06/07/18 (!) 144/68  10/10/17 130/60     Review of Systems  Constitutional: Negative for unexpected weight change.  HENT: Negative for rhinorrhea.   Respiratory: Negative for cough and shortness of breath.   Cardiovascular: Negative for chest pain.  Gastrointestinal: Negative for constipation and diarrhea.  Genitourinary: Negative for difficulty urinating.  Musculoskeletal: Positive for arthralgias.       Intermittent left knee pain, doing yoga which helps.   Skin: Negative for rash.  Allergic/Immunologic: Negative for environmental allergies.  Neurological: Negative for dizziness, numbness and headaches.  Psychiatric/Behavioral:       Stress at work, plans on switching roles next year.        Past Medical History:  Diagnosis Date  . Chicken pox   . Depression   . Hypertension   . Stress incontinence      Social History   Socioeconomic History  . Marital status: Married    Spouse name: Not on file  . Number of children: Not on file  . Years of education: Not on file  . Highest education level: Not on file  Occupational History  . Not on file  Social Needs  . Financial resource  strain: Not on file  . Food insecurity:    Worry: Not on file    Inability: Not on file  . Transportation needs:    Medical: Not on file    Non-medical: Not on file  Tobacco Use  . Smoking status: Current Every Day Smoker    Packs/day: 0.20    Types: Cigarettes  . Smokeless tobacco: Never Used  Substance and Sexual Activity  . Alcohol use: Yes    Alcohol/week: 0.0 standard drinks    Comment: 1 to 2 glass of wine daily  . Drug use: No  . Sexual activity: Not on file  Lifestyle  . Physical activity:    Days per week: Not on file    Minutes per session: Not on file  . Stress: Not on file  Relationships  . Social connections:    Talks on phone: Not on file    Gets together: Not on file    Attends religious service: Not on file    Active member of club or organization: Not on file    Attends meetings of clubs or organizations: Not on file    Relationship status: Not on file  . Intimate partner violence:    Fear of current or ex partner: Not on file    Emotionally abused: Not on file    Physically abused: Not on file    Forced sexual activity: Not on file  Other Topics Concern  . Not on file  Social History Narrative   Married.  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   Enjoys swimming, gardening, growing herbs, playing piano, reading.    Past Surgical History:  Procedure Laterality Date  . KNEE SURGERY    . ROTATOR CUFF REPAIR      Family History  Problem Relation Age of Onset  . Arthritis Mother   . Cancer Mother        breast  . Breast cancer Mother 27       9  . Arthritis Father   . Hypertension Father   . Cancer Paternal Grandmother        breast    No Known Allergies  Current Outpatient Medications on File Prior to Visit  Medication Sig Dispense Refill  . amLODipine (NORVASC) 10 MG tablet Take 1 tablet (10 mg total) by mouth daily. 30 tablet 0  . lisinopril (PRINIVIL,ZESTRIL) 40 MG tablet TAKE ONE TABLET BY MOUTH DAILY 30 tablet 0  . venlafaxine XR  (EFFEXOR-XR) 150 MG 24 hr capsule TAKE ONE CAPSULE BY MOUTH DAILY WITH BREAKFAST 30 capsule 0   No current facility-administered medications on file prior to visit.     BP 136/70   Pulse (!) 104   Temp 98.2 F (36.8 C) (Oral)   Ht 5\' 4"  (1.626 m)   Wt 174 lb (78.9 kg)   SpO2 98%   BMI 29.87 kg/m    Objective:   Physical Exam  Constitutional: She is oriented to person, place, and time. She appears well-nourished.  HENT:  Mouth/Throat: No oropharyngeal exudate.  Eyes: Pupils are equal, round, and reactive to light. EOM are normal.  Neck: Neck supple. No thyromegaly present.  Cardiovascular: Normal rate and regular rhythm.  Respiratory: Effort normal and breath sounds normal.  GI: Soft. Bowel sounds are normal. There is no tenderness.  Musculoskeletal: Normal range of motion.  Neurological: She is alert and oriented to person, place, and time.  Skin: Skin is warm and dry.  Psychiatric: She has a normal mood and affect.           Assessment & Plan:

## 2018-09-29 NOTE — Patient Instructions (Signed)
Stop by the lab prior to leaving today. I will notify you of your results once received.   Start exercising. You should be getting 150 minutes of moderate intensity exercise weekly.  Continue to work on [employer]. Increase vegetables, fruit, whole grains, lean protein.  Return the Cologuard Kit once received.   It was a pleasure to see you today!   Preventive Care 40-64 Years, Female Preventive care refers to lifestyle choices and visits with your health care provider that can promote health and wellness. What does preventive care include?  A yearly physical exam. This is also called an annual well check.  Dental exams once or twice a year.  Routine eye exams. Ask your health care provider how often you should have your eyes checked.  Personal lifestyle choices, including: ? Daily care of your teeth and gums. ? Regular physical activity. ? Eating a healthy diet. ? Avoiding tobacco and drug use. ? Limiting alcohol use. ? Practicing safe sex. ? Taking low-dose aspirin daily starting at age 70. ? Taking vitamin and mineral supplements as recommended by your health care provider. What happens during an annual well check? The services and screenings done by your health care provider during your annual well check will depend on your age, overall health, lifestyle risk factors, and family history of disease. Counseling Your health care provider may ask you questions about your:  Alcohol use.  Tobacco use.  Drug use.  Emotional well-being.  Home and relationship well-being.  Sexual activity.  Eating habits.  Work and work Statistician.  Method of birth control.  Menstrual cycle.  Pregnancy history.  Screening You may have the following tests or measurements:  Height, weight, and BMI.  Blood pressure.  Lipid and cholesterol levels. These may be checked every 5 years, or more frequently if you are over 33 years old.  Skin check.  Lung cancer screening. You may  have this screening every year starting at age 51 if you have a 30-pack-year history of smoking and currently smoke or have quit within the past 15 years.  Fecal occult blood test (FOBT) of the stool. You may have this test every year starting at age 42.  Flexible sigmoidoscopy or colonoscopy. You may have a sigmoidoscopy every 5 years or a colonoscopy every 10 years starting at age 30.  Hepatitis C blood test.  Hepatitis B blood test.  Sexually transmitted disease (STD) testing.  Diabetes screening. This is done by checking your blood sugar (glucose) after you have not eaten for a while (fasting). You may have this done every 1-3 years.  Mammogram. This may be done every 1-2 years. Talk to your health care provider about when you should start having regular mammograms. This may depend on whether you have a family history of breast cancer.  BRCA-related cancer screening. This may be done if you have a family history of breast, ovarian, tubal, or peritoneal cancers.  Pelvic exam and Pap test. This may be done every 3 years starting at age 71. Starting at age 52, this may be done every 5 years if you have a Pap test in combination with an HPV test.  Bone density scan. This is done to screen for osteoporosis. You may have this scan if you are at high risk for osteoporosis.  Discuss your test results, treatment options, and if necessary, the need for more tests with your health care provider. Vaccines Your health care provider may recommend certain vaccines, such as:  Influenza vaccine. This is  recommended every year.  Tetanus, diphtheria, and acellular pertussis (Tdap, Td) vaccine. You may need a Td booster every 10 years.  Varicella vaccine. You may need this if you have not been vaccinated.  Zoster vaccine. You may need this after age 50.  Measles, mumps, and rubella (MMR) vaccine. You may need at least one dose of MMR if you were born in 1957 or later. You may also need a second  dose.  Pneumococcal 13-valent conjugate (PCV13) vaccine. You may need this if you have certain conditions and were not previously vaccinated.  Pneumococcal polysaccharide (PPSV23) vaccine. You may need one or two doses if you smoke cigarettes or if you have certain conditions.  Meningococcal vaccine. You may need this if you have certain conditions.  Hepatitis A vaccine. You may need this if you have certain conditions or if you travel or work in places where you may be exposed to hepatitis A.  Hepatitis B vaccine. You may need this if you have certain conditions or if you travel or work in places where you may be exposed to hepatitis B.  Haemophilus influenzae type b (Hib) vaccine. You may need this if you have certain conditions.  Talk to your health care provider about which screenings and vaccines you need and how often you need them. This information is not intended to replace advice given to you by your health care provider. Make sure you discuss any questions you have with your health care provider. Document Released: 12/19/2015 Document Revised: 08/11/2016 Document Reviewed: 09/23/2015 Elsevier Interactive Patient Education  [employer].

## 2018-09-29 NOTE — Assessment & Plan Note (Signed)
Doing well on venlafaxine ER 150 mg. Continue same. Denies SI/HI. 

## 2018-09-29 NOTE — Assessment & Plan Note (Signed)
Immunizations UTD. Pap smear UTD, due next year. Mammogram UTD. Colon Cancer screening due, she opts for cologuard.  Discussed to work on diet, start regular exercise.  Exam unremarkable. Labs pending. Follow up in 1 year for CPE.

## 2018-09-29 NOTE — Assessment & Plan Note (Signed)
Stable in the office today, continue current regimen. 

## 2018-10-03 ENCOUNTER — Other Ambulatory Visit: Payer: BC Managed Care – PPO

## 2018-10-06 ENCOUNTER — Encounter: Payer: BC Managed Care – PPO | Admitting: Primary Care

## 2018-10-20 ENCOUNTER — Other Ambulatory Visit: Payer: Self-pay | Admitting: Primary Care

## 2018-10-20 DIAGNOSIS — F330 Major depressive disorder, recurrent, mild: Secondary | ICD-10-CM

## 2018-10-20 DIAGNOSIS — I10 Essential (primary) hypertension: Secondary | ICD-10-CM

## 2018-12-24 IMAGING — MG MM DIGITAL DIAGNOSTIC UNILAT*L* W/ TOMO W/ CAD
6 series · 6 of 14 positions shown · non-contrast
Comparison: Previous exam(s).

CLINICAL DATA: Left superior breast asymmetry seen on most recent
screening mammography.

EXAM:
2D DIGITAL DIAGNOSTIC UNILATERAL LEFT MAMMOGRAM WITH CAD AND ADJUNCT
TOMO

[L MLO]
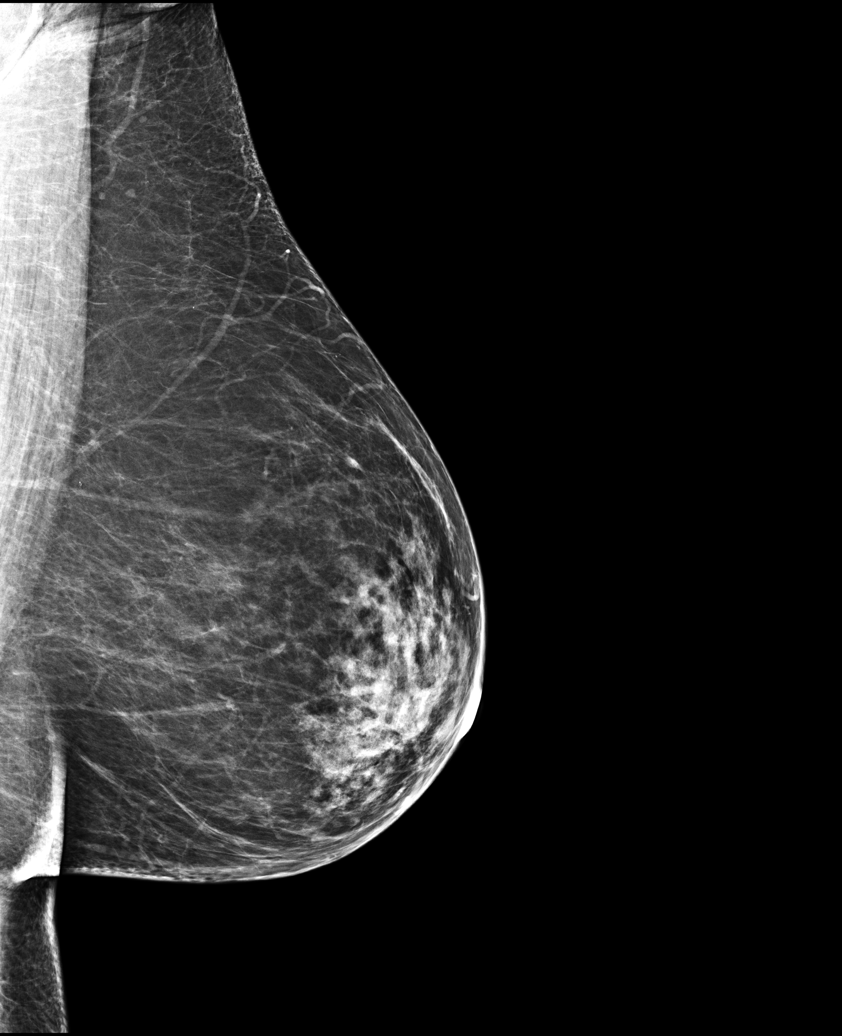

[L CC synth-2D]
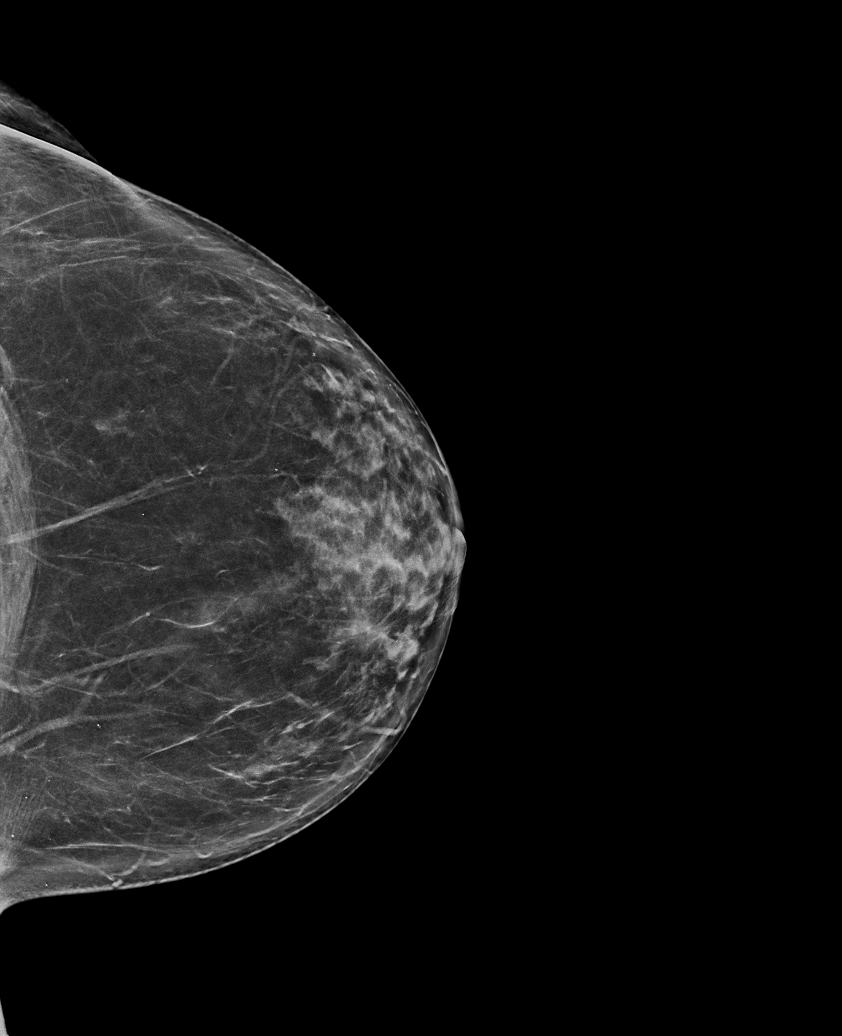

[L CC]
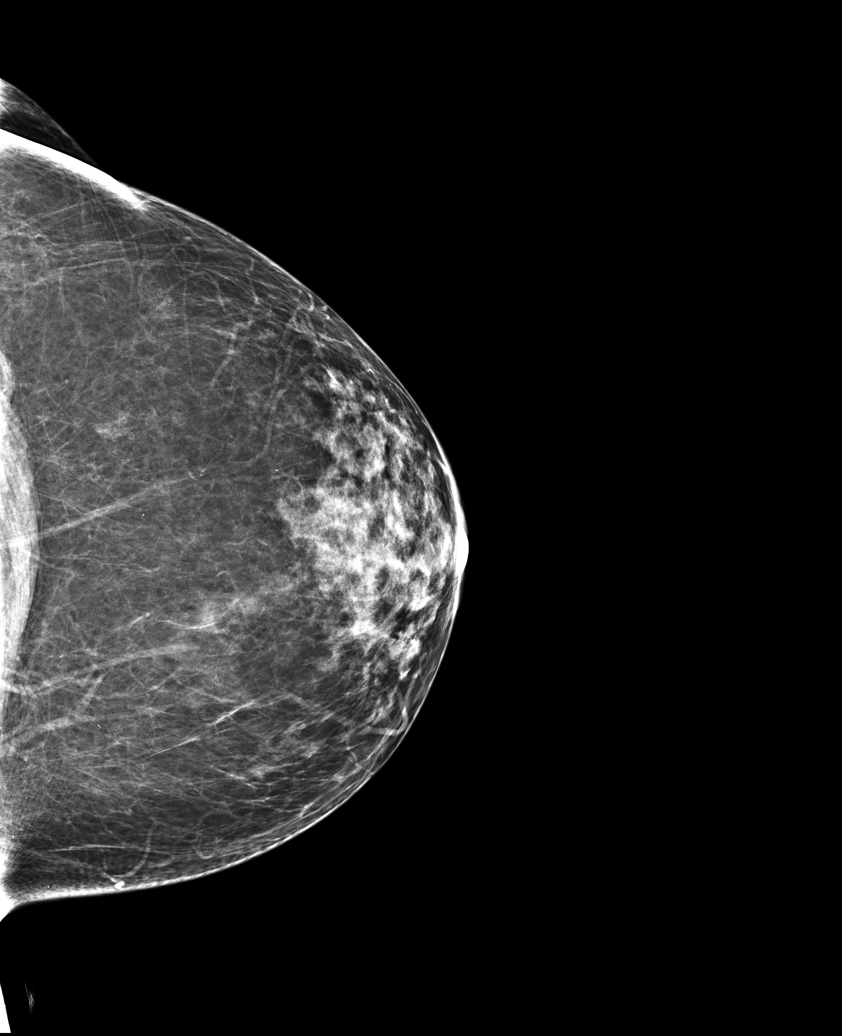

[L MLO synth-2D]
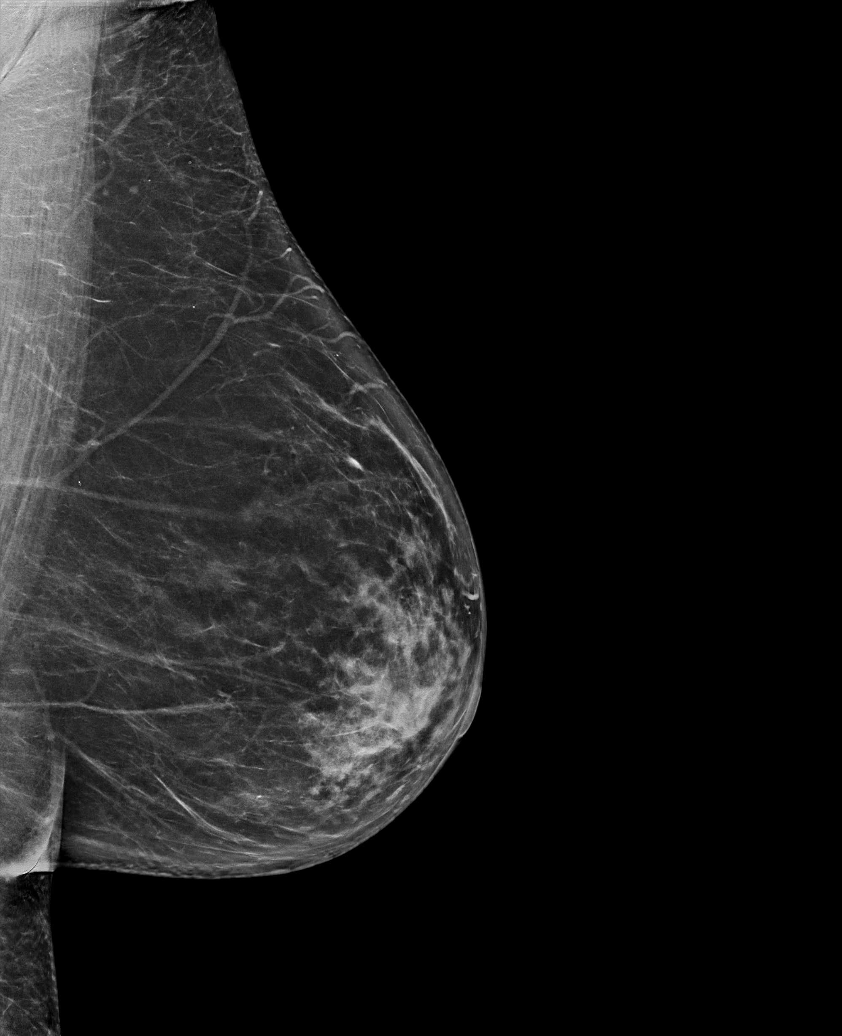

[L MLO tomo · tomo slice 37/74.0]
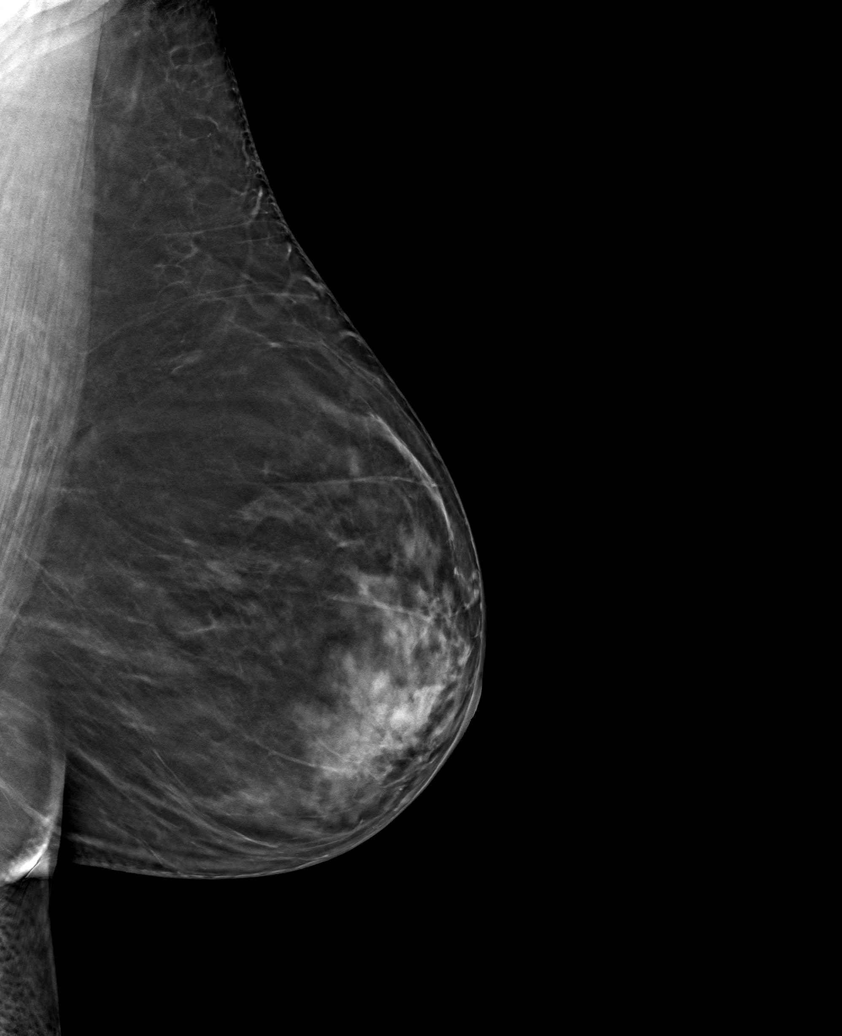

[L CC tomo · tomo slice 37/74.0]
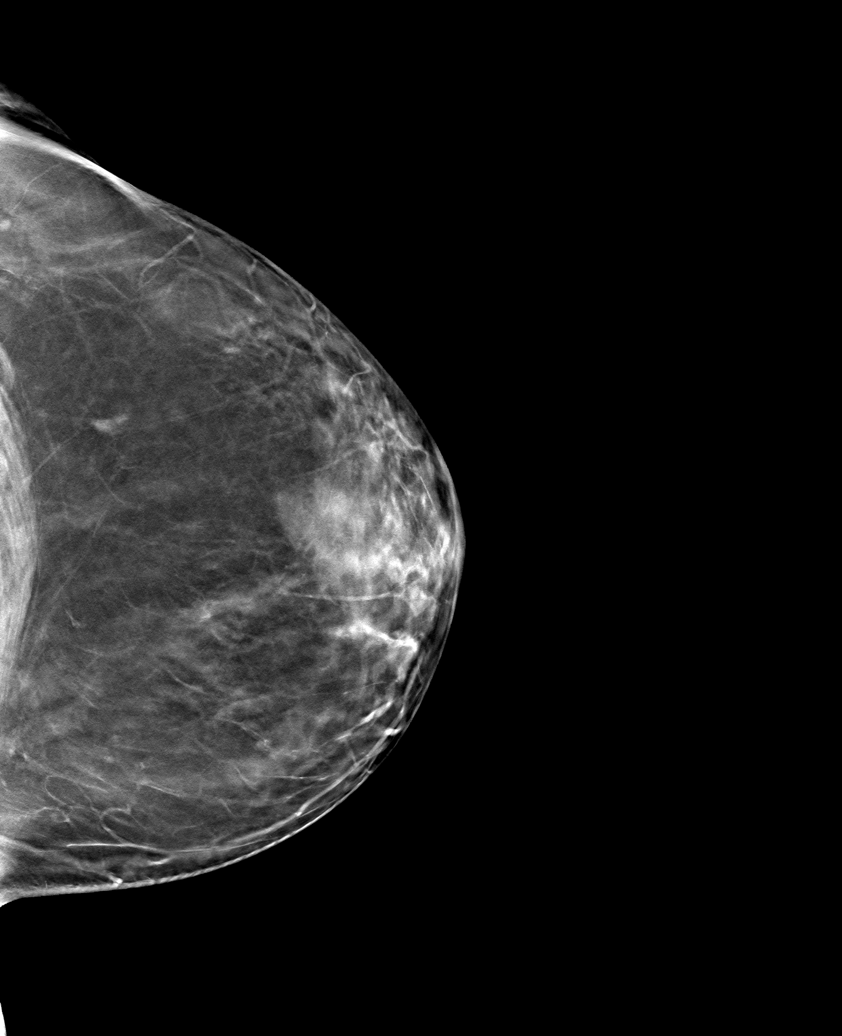

[6 of 14 positions shown; findings below may reference images not displayed]

ACR Breast Density Category b: There are scattered areas of
fibroglandular density.
FINDINGS: Additional mammographic views of the left breast demonstrate
effacement of previously seen asymmetry in the superior left breast.
There are no suspicious masses or areas of architectural distortion.

Mammographic images were processed with CAD.
IMPRESSION: No mammographic evidence of malignancy in the left breast.

RECOMMENDATION:
Screening mammogram in one year.(Code:KM-G-XJ2)

I have discussed the findings and recommendations with the patient.
Results were also provided in writing at the conclusion of the
visit. If applicable, a reminder letter will be sent to the patient
regarding the next appointment.

BI-RADS CATEGORY  1: Negative.

## 2019-04-19 ENCOUNTER — Other Ambulatory Visit: Payer: Self-pay | Admitting: Primary Care

## 2019-04-19 DIAGNOSIS — F330 Major depressive disorder, recurrent, mild: Secondary | ICD-10-CM

## 2019-04-19 DIAGNOSIS — I10 Essential (primary) hypertension: Secondary | ICD-10-CM

## 2019-08-30 ENCOUNTER — Ambulatory Visit: Payer: BC Managed Care – PPO

## 2019-08-30 ENCOUNTER — Other Ambulatory Visit: Payer: Self-pay | Admitting: Primary Care

## 2019-08-30 DIAGNOSIS — Z1231 Encounter for screening mammogram for malignant neoplasm of breast: Secondary | ICD-10-CM

## 2019-09-11 ENCOUNTER — Ambulatory Visit (INDEPENDENT_AMBULATORY_CARE_PROVIDER_SITE_OTHER): Payer: BC Managed Care – PPO

## 2019-09-11 ENCOUNTER — Other Ambulatory Visit: Payer: Self-pay

## 2019-09-11 DIAGNOSIS — Z23 Encounter for immunization: Secondary | ICD-10-CM | POA: Diagnosis not present

## 2019-10-03 ENCOUNTER — Ambulatory Visit
Admission: RE | Admit: 2019-10-03 | Discharge: 2019-10-03 | Disposition: A | Discharge status: Home / Self Care | Payer: BC Managed Care – PPO | Source: Ambulatory Visit | Attending: Primary Care | Admitting: Primary Care

## 2019-10-03 DIAGNOSIS — Z1231 Encounter for screening mammogram for malignant neoplasm of breast: Secondary | ICD-10-CM

## 2019-10-25 ENCOUNTER — Other Ambulatory Visit: Payer: Self-pay | Admitting: Primary Care

## 2019-10-25 DIAGNOSIS — I10 Essential (primary) hypertension: Secondary | ICD-10-CM

## 2019-10-25 DIAGNOSIS — F330 Major depressive disorder, recurrent, mild: Secondary | ICD-10-CM

## 2019-11-06 ENCOUNTER — Telehealth: Payer: Self-pay | Admitting: Primary Care

## 2019-11-06 NOTE — Telephone Encounter (Signed)
Appointment 1/28 

## 2019-11-06 NOTE — Telephone Encounter (Signed)
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  I need this to be a virtual appointment and will schedule the labs after we meet.

## 2019-11-13 ENCOUNTER — Ambulatory Visit (INDEPENDENT_AMBULATORY_CARE_PROVIDER_SITE_OTHER): Payer: BC Managed Care – PPO | Admitting: Primary Care

## 2019-11-13 ENCOUNTER — Other Ambulatory Visit: Payer: Self-pay

## 2019-11-13 DIAGNOSIS — I10 Essential (primary) hypertension: Secondary | ICD-10-CM

## 2019-11-13 DIAGNOSIS — F330 Major depressive disorder, recurrent, mild: Secondary | ICD-10-CM

## 2019-11-13 MED ORDER — VENLAFAXINE HCL ER 150 MG PO CP24: 150.0000 mg | ORAL_CAPSULE | Freq: Every day | ORAL | 3 refills | Status: DC

## 2019-11-13 MED ORDER — AMLODIPINE BESYLATE 10 MG PO TABS: 10.0000 mg | ORAL_TABLET | Freq: Every day | ORAL | 3 refills | Status: DC

## 2019-11-13 MED ORDER — LISINOPRIL 40 MG PO TABS: 40.0000 mg | ORAL_TABLET | Freq: Every day | ORAL | 3 refills | Status: DC

## 2019-11-13 NOTE — Progress Notes (Signed)
Subjective:    Patient ID: Theresa Bridges, female    DOB: December 03, 1956, 64 y.o.   MRN: [id number]  HPI  Virtual Visit via Video Note  I connected with Theresa Bridges on 11/13/19 at  3:20 PM EST by a video enabled telemedicine application and verified that I am speaking with the correct person using two identifiers.  Location: Patient: Home Provider: Office   I discussed the limitations of evaluation and management by telemedicine and the availability of in person appointments. The patient expressed understanding and agreed to proceed.  History of Present Illness:  Theresa Bridges is a 63 year old female with a history of depression, hypertension who presents today for medication refills.   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 She is currently manage on venlafaxine ER 150 mg once daily and believes that she's doing well overall.   She is trying to control her stress through walking during the day and cooking, this has helped some and she feels it will continue to help.    She's checking her BP once weekly and is getting readings of 135's-140's systolic, hasn't noticed the diastolic reading. She hasn't been exercising and endorses a poor diet since Covid-19. She plans on exercising and working on lifestyle changes.   BP Readings from Last 3 Encounters:  09/29/18 136/70  06/07/18 (!) 144/68  10/10/17 130/60      Observations/Objective:  Alert and oriented. Appears well, not sickly. No distress. Speaking in complete sentences.   Assessment and Plan:  See problem based charting.  Follow Up Instructions:  Start monitoring your blood pressure 2-3 times weekly, around the same time of day, for the next 2-3 weeks.  Ensure that you have rested for 30 minutes prior to checking your blood pressure. Record your readings and update me in early February 2021 as discussed.  We need updated labs, I forgot to mention this during our video visit.  Please call the main office to schedule a lab only appointment.  I went ahead and sent plenty of refills of your medications to [facility].  It was nice to see you! Mayra Reel, NP-C     I discussed the assessment and treatment plan with the patient. The patient was provided an opportunity to ask questions and all were answered. The patient agreed with the plan and demonstrated an understanding of the instructions.   The patient was advised to call back or seek an in-person evaluation if the symptoms worsen or if the condition fails to improve as anticipated.  This visit occurred during the SARS-CoV-2 public health emergency.  Safety protocols were in place, including screening questions prior to the visit, additional usage of staff PPE, and extensive cleaning of exam room while observing appropriate contact time as indicated for disinfecting solutions.     Doreene Nest, NP    Review of Systems  Respiratory: Negative for shortness of breath.   Cardiovascular: Negative for chest pain.  Neurological: Negative for dizziness and headaches.  Psychiatric/Behavioral:       See HPI       Past Medical History:  Diagnosis Date  . Chicken pox   . Depression   . Hypertension   . Stress incontinence      Social History   Socioeconomic History  . Marital status: Married    Spouse name: Not on file  . Number of children: Not on file  . Years of education: Not on  file  . Highest education level: Not on file  Occupational History  . Not on file  Social Needs  . Financial resource strain: Not on file  . Food insecurity    Worry: Not on file    Inability: Not on file  . Transportation needs    Medical: Not on file    Non-medical: Not on file  Tobacco Use  . Smoking status: Current Every Day Smoker    Packs/day: 0.20    Types: Cigarettes  . Smokeless tobacco: Never Used  Substance and Sexual Activity  . Alcohol use: Yes    Alcohol/week: 0.0 standard drinks     Comment: 1 to 2 glass of wine daily  . Drug use: No  . Sexual activity: Not on file  Lifestyle  . Physical activity    Days per week: Not on file    Minutes per session: Not on file  . Stress: Not on file  Relationships  . Social Herbalist on phone: Not on file    Gets together: Not on file    Attends religious service: Not on file    Active member of club or organization: Not on file    Attends meetings of clubs or organizations: Not on file    Relationship status: Not on file  . Intimate partner violence    Fear of current or ex partner: Not on file    Emotionally abused: Not on file    Physically abused: Not on file    Forced sexual activity: Not on file  Other Topics Concern  . Not on file  Social History Narrative   Married.   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   Enjoys swimming, gardening, growing herbs, playing piano, reading.    Past Surgical History:  Procedure Laterality Date  . KNEE SURGERY    . ROTATOR CUFF REPAIR      Family History  Problem Relation Age of Onset  . Arthritis Mother   . Cancer Mother        breast  . Breast cancer Mother 34       80  . Arthritis Father   . Hypertension Father   . Cancer Paternal Grandmother        breast  . Breast cancer Paternal Grandmother 54    No Known Allergies  No current outpatient medications on file prior to visit.   No current facility-administered medications on file prior to visit.     There were no vitals taken for this visit.   Objective:   Physical Exam  Constitutional: She is oriented to person, place, and time. She appears well-developed.  Respiratory: Effort normal.  Neurological: She is alert and oriented to person, place, and time.  Psychiatric: She has a normal mood and affect.           Assessment & Plan:

## 2019-11-13 NOTE — Patient Instructions (Signed)
Start monitoring your blood pressure 2-3 times weekly, around the same time of day, for the next 2-3 weeks.  Ensure that you have rested for 30 minutes prior to checking your blood pressure. Record your readings and update me in early February 2021 as discussed.  We need updated labs, I forgot to mention this during our video visit. Please call the main office to schedule a lab only appointment.  I went ahead and sent plenty of refills of your medications to [facility].  It was nice to see you! Allie Bossier, NP-C

## 2019-11-13 NOTE — Assessment & Plan Note (Signed)
Home readings are above goal on amlodipine 10 mg and lisinopril 40 mg. She would like to work on exercise and a healthy diet.  We will have her monitor BP at home and if she continues to see readings at or above 135/90 then we will add in additional treatment. She will update in early February 2021.  Labs pending.

## 2019-11-13 NOTE — Assessment & Plan Note (Signed)
Overall seems to be doing well on venlafaxine as prescribed. Refills sent to pharmacy.

## 2020-01-29 NOTE — Telephone Encounter (Signed)
Theresa Bridges, can you schedule her virtually in a slot before a block or at the end of a session? This will take quite a bit of time.

## 2020-01-29 NOTE — Telephone Encounter (Signed)
Called patient to get her scheduled. No answer and mailbox is full. See Kate's message for scheduling instructions.

## 2020-02-06 ENCOUNTER — Encounter: Payer: Self-pay | Admitting: Primary Care

## 2020-02-06 ENCOUNTER — Other Ambulatory Visit: Payer: Self-pay

## 2020-02-06 ENCOUNTER — Telehealth: Payer: BC Managed Care – PPO | Admitting: Primary Care

## 2020-09-17 DIAGNOSIS — I10 Essential (primary) hypertension: Secondary | ICD-10-CM

## 2020-09-17 DIAGNOSIS — F3342 Major depressive disorder, recurrent, in full remission: Secondary | ICD-10-CM

## 2020-09-18 MED ORDER — VENLAFAXINE HCL ER 150 MG PO CP24: 150.0000 mg | ORAL_CAPSULE | Freq: Every day | ORAL | 0 refills | Status: DC

## 2020-09-18 MED ORDER — LISINOPRIL 40 MG PO TABS: 40.0000 mg | ORAL_TABLET | Freq: Every day | ORAL | 0 refills | Status: DC

## 2020-09-18 MED ORDER — AMLODIPINE BESYLATE 10 MG PO TABS: 10.0000 mg | ORAL_TABLET | Freq: Every day | ORAL | 0 refills | Status: DC

## 2020-11-07 ENCOUNTER — Telehealth: Payer: Self-pay | Admitting: Primary Care

## 2020-11-07 NOTE — Telephone Encounter (Addendum)
Theresa Bridges, this patient is overdue for labs (never obtained them last year despite my request) and follow up. She MUST come in the office this month for follow up and labs.

## 2020-11-07 NOTE — Telephone Encounter (Signed)
Called patient and LVM per DPR to call back and schedule.

## 2020-11-13 NOTE — Telephone Encounter (Signed)
Called to schedule an appointment. LVM to call back. Letter sent.

## 2020-11-17 ENCOUNTER — Other Ambulatory Visit: Payer: Self-pay | Admitting: Primary Care

## 2020-11-17 DIAGNOSIS — I10 Essential (primary) hypertension: Secondary | ICD-10-CM

## 2020-11-17 DIAGNOSIS — F330 Major depressive disorder, recurrent, mild: Secondary | ICD-10-CM

## 2020-12-11 DIAGNOSIS — F3342 Major depressive disorder, recurrent, in full remission: Secondary | ICD-10-CM

## 2020-12-11 DIAGNOSIS — I10 Essential (primary) hypertension: Secondary | ICD-10-CM

## 2020-12-12 MED ORDER — VENLAFAXINE HCL ER 150 MG PO CP24: 150.0000 mg | ORAL_CAPSULE | Freq: Every day | ORAL | 0 refills | Status: DC

## 2020-12-12 MED ORDER — LISINOPRIL 40 MG PO TABS
40.0000 mg | ORAL_TABLET | Freq: Every day | ORAL | 0 refills | Status: DC
Start: 2020-12-12 — End: 2020-12-19

## 2020-12-12 MED ORDER — AMLODIPINE BESYLATE 10 MG PO TABS: 10.0000 mg | ORAL_TABLET | Freq: Every day | ORAL | 0 refills | Status: DC

## 2020-12-19 ENCOUNTER — Encounter: Payer: Self-pay | Admitting: Primary Care

## 2020-12-19 ENCOUNTER — Other Ambulatory Visit: Payer: Self-pay

## 2020-12-19 ENCOUNTER — Ambulatory Visit (INDEPENDENT_AMBULATORY_CARE_PROVIDER_SITE_OTHER): Payer: BC Managed Care – PPO | Admitting: Primary Care

## 2020-12-19 VITALS — BP 137/88 | HR 113 | Temp 97.0°F | Ht 64.0 in | Wt 191.0 lb

## 2020-12-19 DIAGNOSIS — I10 Essential (primary) hypertension: Secondary | ICD-10-CM | POA: Diagnosis not present

## 2020-12-19 DIAGNOSIS — R000 Tachycardia, unspecified: Secondary | ICD-10-CM

## 2020-12-19 DIAGNOSIS — Z1231 Encounter for screening mammogram for malignant neoplasm of breast: Secondary | ICD-10-CM

## 2020-12-19 DIAGNOSIS — Z23 Encounter for immunization: Secondary | ICD-10-CM | POA: Diagnosis not present

## 2020-12-19 DIAGNOSIS — F32A Depression, unspecified: Secondary | ICD-10-CM

## 2020-12-19 DIAGNOSIS — F419 Anxiety disorder, unspecified: Secondary | ICD-10-CM

## 2020-12-19 DIAGNOSIS — Z1211 Encounter for screening for malignant neoplasm of colon: Secondary | ICD-10-CM

## 2020-12-19 LAB — CBC
HCT: 40.1 % (ref 36.0–46.0)
Hemoglobin: 13.6 g/dL (ref 12.0–15.0)
MCHC: 33.8 g/dL (ref 30.0–36.0)
MCV: 99.5 fl (ref 78.0–100.0)
Platelets: 332 10*3/uL (ref 150.0–400.0)
RBC: 4.03 Mil/uL (ref 3.87–5.11)
RDW: 13.4 % (ref 11.5–15.5)
WBC: 8.2 10*3/uL (ref 4.0–10.5)

## 2020-12-19 LAB — COMPREHENSIVE METABOLIC PANEL
ALT: 18 U/L (ref 0–35)
AST: 19 U/L (ref 0–37)
Albumin: 4.6 g/dL (ref 3.5–5.2)
Alkaline Phosphatase: 98 U/L (ref 39–117)
BUN: 17 mg/dL (ref 6–23)
CO2: 24 mEq/L (ref 19–32)
Calcium: 9.5 mg/dL (ref 8.4–10.5)
Chloride: 98 mEq/L (ref 96–112)
Creatinine, Ser: 0.96 mg/dL (ref 0.40–1.20)
GFR: 63.23 mL/min (ref >60.00)
Glucose, Bld: 87 mg/dL (ref 70–99)
Potassium: 4.8 mEq/L (ref 3.5–5.1)
Sodium: 133 mEq/L — ABNORMAL LOW (ref 135–145)
Total Bilirubin: 0.4 mg/dL (ref 0.2–1.2)
Total Protein: 7.5 g/dL (ref 6.0–8.3)

## 2020-12-19 LAB — LIPID PANEL
Cholesterol: 229 mg/dL — ABNORMAL HIGH (ref 0–200)
HDL: 63.2 mg/dL (ref >39.00)
LDL Cholesterol: 140 mg/dL — ABNORMAL HIGH (ref 0–99)
NonHDL: 166.17
Total CHOL/HDL Ratio: 4
Triglycerides: 129 mg/dL (ref 0.0–149.0)
VLDL: 25.8 mg/dL (ref 0.0–40.0)

## 2020-12-19 LAB — HEMOGLOBIN A1C: Hgb A1c MFr Bld: 6 % (ref 4.6–6.5)

## 2020-12-19 LAB — TSH: TSH: 2.53 u[IU]/mL (ref 0.35–4.50)

## 2020-12-19 MED ORDER — OLMESARTAN MEDOXOMIL 20 MG PO TABS: 20.0000 mg | ORAL_TABLET | Freq: Every day | ORAL | 0 refills | Status: DC

## 2020-12-19 NOTE — Addendum Note (Signed)
Addended by: Donnamarie Poag on: 12/19/2020 11:29 AM   Modules accepted: Orders

## 2020-12-19 NOTE — Patient Instructions (Signed)
Stop by the lab prior to leaving today. I will notify you of your results once received.   Stop taking lisinopril 40 mg for blood pressure. Start taking olmesartan 20 mg once daily for blood pressure. Continue taking amlodipine 10 mg once daily for blood pressure.  Please monitor your blood pressure and heart rate at home for the next 2 weeks, send me readings on my chart in 2 weeks.  [facility].   Complete Cologuard once received.  Schedule a nurse visit for repeat shingles vaccine in 2-6 months.  It was a pleasure to see you today!

## 2020-12-19 NOTE — Assessment & Plan Note (Signed)
Above goal, especially given her smoking history.  She is at max dose of lisinopril at 40mg , max dose of amlodipine at 10 mg, cannot tolerate HCTZ.  She is tachycardic today, also on prior visits, does smoke and smoked prior to her visit this morning. Also with stress today and chronic anxiety.  Stop lisinopril 40 mg. Start olmesartan 20 mg.  Continue amlodipine 10 mg.  She will send readings of both HR and BP via my chart in 2 weeks. Consider beta blocker if needed.  CMP pending today.

## 2020-12-19 NOTE — Assessment & Plan Note (Signed)
Chronic, feels well on venlafaxine Er 150 mg and would like to continue. Her recent anxiety is situational per patient, she would not like to change her regimen.  Continue venlafaxine ER 150 mg.  Continue to monitor.

## 2020-12-19 NOTE — Progress Notes (Signed)
Subjective:    Patient ID: Theresa Bridges, female    DOB: Aug 09, 1956, 65 y.o.   MRN: [id number]  HPI  This visit occurred during the SARS-CoV-2 public health emergency.  Safety protocols were in place, including screening questions prior to the visit, additional usage of staff PPE, and extensive cleaning of exam room while observing appropriate contact time as indicated for disinfecting solutions.   Theresa Bridges is a 65 year old female with a  History of anxiety, hypertension, depression who presents today for follow up. She is overdue for colon cancer screening and mammgoram. She would like to have the shingles vaccine.  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 She is under a tremendous amount of stress at work. She is frustrated with the kids at school because they won't keep their masks on, she is afraid of contracting Covid-19. She is tearful throughout the day, feels constantly stressed, feeling nervous and anxious.   When she's at home on the weekends her stress resolves, but Sunday night comes symptoms begin again.   Currently managed on venlafaxine XR 150 mg daily and feels that this has been effective overall.   2) Essential Hypertension: Currently managed on lisinopril 40 mg and amlodipine 10 mg. She is smoking 5 cigarettes daily, had a cigarette prior to her visit today. Denies palpitations on a day to day, does notice today. She does not checking her BP or HR at home.   She is not exercising, endorses a poor diet. She denies headaches, dizziness, blurred vision,exertional chest pain, exertional shortness of breath. ,.   BP Readings from Last 3 Encounters:  12/19/20 137/88  09/29/18 136/70  06/07/18 (!) 144/68     Review of Systems  Eyes: Negative for visual disturbance.  Respiratory: Negative for shortness of breath.   Cardiovascular: Negative for chest pain.  Neurological: Negative for dizziness and headaches.  Psychiatric/Behavioral:  The patient is nervous/anxious.        Past Medical History:  Diagnosis Date  . Chicken pox   . Depression   . Hypertension   . Stress incontinence      Social History   Socioeconomic History  . Marital status: Married    Spouse name: Not on file  . Number of children: Not on file  . Years of education: Not on file  . Highest education level: Not on file  Occupational History  . Not on file  Tobacco Use  . Smoking status: Current Every Day Smoker    Packs/day: 0.20    Types: Cigarettes  . Smokeless tobacco: Never Used  Substance and Sexual Activity  . Alcohol use: Yes    Alcohol/week: 0.0 standard drinks    Comment: 1 to 2 glass of wine daily  . Drug use: No  . Sexual activity: Not on file  Other Topics Concern  . Not on file  Social History Narrative   Married.   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   Enjoys swimming, gardening, growing herbs, playing piano, reading.   Social Determinants of Health   Financial Resource Strain: Not on file  Food Insecurity: Not on file  Transportation Needs: Not on file  Physical Activity: Not on file  Stress: Not on file  Social Connections: Not on file  Intimate Partner Violence: Not on file    Past Surgical History:  Procedure Laterality Date  . KNEE SURGERY    . ROTATOR CUFF REPAIR      Family  History  Problem Relation Age of Onset  . Arthritis Mother   . Cancer Mother        breast  . Breast cancer Mother 62       44  . Arthritis Father   . Hypertension Father   . Cancer Paternal Grandmother        breast  . Breast cancer Paternal Grandmother 27    No Known Allergies  Current Outpatient Medications on File Prior to Visit  Medication Sig Dispense Refill  . amLODipine (NORVASC) 10 MG tablet Take 1 tablet (10 mg total) by mouth daily. For blood pressure. 90 tablet 3  . venlafaxine XR (EFFEXOR-XR) 150 MG 24 hr capsule Take 1 capsule (150 mg total) by mouth daily with breakfast. 90 capsule 3   No current  facility-administered medications on file prior to visit.    BP 137/88   Pulse (!) 113   Temp (!) 97 F (36.1 C) (Temporal)   Ht 5\' 4"  (1.626 m)   Wt 191 lb (86.6 kg)   SpO2 97%   BMI 32.79 kg/m    Objective:   Physical Exam Constitutional:      Appearance: She is well-nourished.  Cardiovascular:     Rate and Rhythm: Regular rhythm. Tachycardia present.     Comments: Rate of 102 during exam Pulmonary:     Effort: Pulmonary effort is normal.     Breath sounds: Normal breath sounds.  Musculoskeletal:     Cervical back: Neck supple.  Skin:    General: Skin is warm and dry.  Neurological:     Mental Status: She is alert.  Psychiatric:        Mood and Affect: Mood and affect and mood normal.     Comments: Appears stressed            Assessment & Plan:

## 2020-12-23 DIAGNOSIS — E785 Hyperlipidemia, unspecified: Secondary | ICD-10-CM

## 2020-12-23 MED ORDER — ROSUVASTATIN CALCIUM 10 MG PO TABS: 10.0000 mg | ORAL_TABLET | Freq: Every day | ORAL | 3 refills | Status: DC

## 2020-12-28 DIAGNOSIS — E785 Hyperlipidemia, unspecified: Secondary | ICD-10-CM

## 2020-12-29 MED ORDER — ROSUVASTATIN CALCIUM 10 MG PO TABS
10.0000 mg | ORAL_TABLET | Freq: Every day | ORAL | 3 refills | Status: DC
Start: 2020-12-29 — End: 2022-03-23

## 2021-01-03 ENCOUNTER — Encounter: Payer: Self-pay | Admitting: Family Medicine

## 2021-01-03 ENCOUNTER — Telehealth (INDEPENDENT_AMBULATORY_CARE_PROVIDER_SITE_OTHER): Payer: BC Managed Care – PPO | Admitting: Family Medicine

## 2021-01-03 DIAGNOSIS — U071 COVID-19: Secondary | ICD-10-CM

## 2021-01-03 NOTE — Telephone Encounter (Signed)
Pt seen on 1/29

## 2021-01-03 NOTE — Progress Notes (Signed)
    I connected with [name] on 01/03/21 at 11:40 AM EST by video and verified that I am speaking with the correct person using two identifiers.   I discussed the limitations, risks, security and privacy concerns of performing an evaluation and management service by video and the availability of in person appointments. I also discussed with the patient that there may be a patient responsible charge related to this service. The patient expressed understanding and agreed to proceed.  Patient location: Home Provider Location: [facility] Participants: Lynnda Child and [name]   Subjective:     Theresa Bridges is a 65 y.o. female presenting for Covid Positive (Sx onset on 1/24. 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   Respiratory: Positive for cough.   Neurological: Positive for headaches.     Social History   Tobacco Use  Smoking Status Current Every Day Smoker  . Packs/day: 0.20  . Types: Cigarettes  Smokeless Tobacco Never Used        Objective:   BP Readings from Last 3 Encounters:  12/19/20 137/88  09/29/18 136/70  06/07/18 (!) 144/68   Wt Readings from Last 3 Encounters:  12/19/20 191 lb (86.6 kg)  09/29/18 174 lb (78.9 kg)  06/07/18 175 lb (79.4 kg)    Phone Visit Speaking in complete sentences Alert and oriented No distress  Mood good        Assessment & Plan:   Problem List Items Addressed This Visit   None   Visit Diagnoses     COVID-19 virus infection    -  Primary     Overall pt doing well ER and return precautions for worsening unimproved fatigue Letter for work    Librarian, academic were attempted between this provider and patient, however failed, due to patient having technical difficulties OR patient did not have access to video capability.  We continued and completed visit with audio only.   Start Time: 10:57 End Time: 11:11    Return if symptoms worsen or fail to improve.  Lynnda Child, MD

## 2021-01-05 NOTE — Telephone Encounter (Signed)
Noted  

## 2021-01-09 ENCOUNTER — Other Ambulatory Visit: Payer: Self-pay | Admitting: Primary Care

## 2021-01-09 DIAGNOSIS — F330 Major depressive disorder, recurrent, mild: Secondary | ICD-10-CM

## 2021-01-09 DIAGNOSIS — I10 Essential (primary) hypertension: Secondary | ICD-10-CM

## 2021-01-12 ENCOUNTER — Other Ambulatory Visit: Payer: Self-pay

## 2021-01-12 DIAGNOSIS — I10 Essential (primary) hypertension: Secondary | ICD-10-CM

## 2021-01-12 MED ORDER — AMLODIPINE BESYLATE 10 MG PO TABS: 10.0000 mg | ORAL_TABLET | Freq: Every day | ORAL | 3 refills | Status: DC

## 2021-01-23 ENCOUNTER — Other Ambulatory Visit: Payer: Self-pay | Admitting: Primary Care

## 2021-01-23 DIAGNOSIS — I10 Essential (primary) hypertension: Secondary | ICD-10-CM

## 2021-02-14 ENCOUNTER — Other Ambulatory Visit: Payer: Self-pay | Admitting: Primary Care

## 2021-02-14 DIAGNOSIS — I10 Essential (primary) hypertension: Secondary | ICD-10-CM

## 2021-03-26 ENCOUNTER — Other Ambulatory Visit: Payer: Self-pay | Admitting: Primary Care

## 2021-03-26 DIAGNOSIS — I10 Essential (primary) hypertension: Secondary | ICD-10-CM

## 2021-04-01 ENCOUNTER — Inpatient Hospital Stay: Admission: RE | Admit: 2021-04-01 | Payer: BC Managed Care – PPO | Source: Ambulatory Visit

## 2021-05-19 ENCOUNTER — Other Ambulatory Visit: Payer: Self-pay

## 2021-05-19 ENCOUNTER — Ambulatory Visit
Admission: RE | Admit: 2021-05-19 | Discharge: 2021-05-19 | Disposition: A | Discharge status: Home / Self Care | Payer: BC Managed Care – PPO | Source: Ambulatory Visit | Attending: Primary Care | Admitting: Primary Care

## 2021-05-19 DIAGNOSIS — Z1231 Encounter for screening mammogram for malignant neoplasm of breast: Secondary | ICD-10-CM | POA: Insufficient documentation

## 2021-06-25 LAB — COLOGUARD
COLOGUARD: NEGATIVE
Cologuard: NEGATIVE

## 2021-06-25 LAB — EXTERNAL GENERIC LAB PROCEDURE: COLOGUARD: NEGATIVE

## 2021-07-22 NOTE — Telephone Encounter (Signed)
Have placed results in your box for review. Not sure why it did not go to Epic. I have abstracted results as well. Will need to send copy to scan after you have sent results to me or patient.

## 2021-07-24 ENCOUNTER — Other Ambulatory Visit: Payer: Self-pay | Admitting: Primary Care

## 2021-07-24 DIAGNOSIS — I10 Essential (primary) hypertension: Secondary | ICD-10-CM

## 2021-09-17 ENCOUNTER — Other Ambulatory Visit: Payer: Self-pay | Admitting: Primary Care

## 2021-09-17 DIAGNOSIS — I10 Essential (primary) hypertension: Secondary | ICD-10-CM

## 2021-09-18 ENCOUNTER — Other Ambulatory Visit: Payer: Self-pay | Admitting: Primary Care

## 2021-09-18 DIAGNOSIS — I10 Essential (primary) hypertension: Secondary | ICD-10-CM

## 2021-12-04 NOTE — Telephone Encounter (Signed)
Called patient appointment made for office next week.

## 2021-12-08 ENCOUNTER — Ambulatory Visit: Payer: BC Managed Care – PPO | Admitting: Internal Medicine

## 2021-12-08 ENCOUNTER — Encounter: Payer: Self-pay | Admitting: Internal Medicine

## 2021-12-08 ENCOUNTER — Ambulatory Visit (INDEPENDENT_AMBULATORY_CARE_PROVIDER_SITE_OTHER): Payer: BC Managed Care – PPO

## 2021-12-08 ENCOUNTER — Other Ambulatory Visit: Payer: Self-pay

## 2021-12-08 VITALS — BP 140/74 | HR 103 | Temp 98.0°F | Ht 64.0 in | Wt 190.0 lb

## 2021-12-08 DIAGNOSIS — M79671 Pain in right foot: Secondary | ICD-10-CM

## 2021-12-08 NOTE — Assessment & Plan Note (Signed)
Seems mechanical She has high arches Will check x-ray Refer to sports medicine Can try ibuprofen 400-600mg  up to tid (at least short term)

## 2021-12-08 NOTE — Progress Notes (Signed)
Subjective:    Patient ID: Theresa Bridges, female    DOB: 1956/10/12, 66 y.o.   MRN: [id number]  HPI Here due to right foot pain  Has had some pain for a few months Now has gotten progressively worse Forefoot (base of great toe)--then along medial side to arch and now towards the medial malleolus Also on the top of foot Awoke in pain about a week ago--and now has been hurting non stop  No prior injury--but then had misstep on uneven pavement 3 days ago May have twisted it then It feels swollen  Tried an advil after the fall--not really helpful  Current Outpatient Medications on File Prior to Visit  Medication Sig Dispense Refill   amLODipine (NORVASC) 10 MG tablet Take 1 tablet (10 mg total) by mouth daily. For blood pressure. 90 tablet 3   olmesartan (BENICAR) 20 MG tablet Take 1 tablet (20 mg total) by mouth daily. For blood pressure. Due in January 2023 for office visit. 90 tablet 0   rosuvastatin (CRESTOR) 10 MG tablet Take 1 tablet (10 mg total) by mouth daily. For cholesterol. 90 tablet 3   venlafaxine XR (EFFEXOR-XR) 150 MG 24 hr capsule Take 1 capsule (150 mg total) by mouth daily with breakfast. For anxiety. 90 capsule 3   No current facility-administered medications on file prior to visit.    No Known Allergies  Past Medical History:  Diagnosis Date   Chicken pox    Depression    Hypertension    Stress incontinence     Past Surgical History:  Procedure Laterality Date   KNEE SURGERY     ROTATOR CUFF REPAIR      Family History  Problem Relation Age of Onset   Arthritis Mother    Cancer Mother        breast   Breast cancer Mother 52       73   Arthritis Father    Hypertension Father    Cancer Paternal Grandmother        breast   Breast cancer Paternal Grandmother 42    Social History   Socioeconomic History   Marital status: Married    Spouse name: Not on file   Number of children: Not on file   Years of education: Not on file   Highest  education level: Not on file  Occupational History   Not on file  Tobacco Use   Smoking status: Every Day    Packs/day: 0.20    Types: Cigarettes   Smokeless tobacco: Never  Substance and Sexual Activity   Alcohol use: Yes    Alcohol/week: 0.0 standard drinks    Comment: 1 to 2 glass of wine daily   Drug use: No   Sexual activity: Not on file  Other Topics Concern   Not on file  Social History Narrative   Married.   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   Enjoys swimming, gardening, growing herbs, playing piano, reading.   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

## 2021-12-14 ENCOUNTER — Encounter: Payer: Self-pay | Admitting: Family Medicine

## 2021-12-14 ENCOUNTER — Ambulatory Visit: Payer: BC Managed Care – PPO | Admitting: Family Medicine

## 2021-12-14 ENCOUNTER — Other Ambulatory Visit: Payer: Self-pay

## 2021-12-14 VITALS — BP 120/60 | HR 105 | Temp 97.9°F | Ht 64.0 in | Wt 191.0 lb

## 2021-12-14 DIAGNOSIS — M79671 Pain in right foot: Secondary | ICD-10-CM | POA: Diagnosis not present

## 2021-12-14 NOTE — Progress Notes (Signed)
Annielee Jemmott T. Ilham Roughton, MD, CAQ Sports [facility] at [facility] [address] [place], [address]  Phone: [phone]   FAX: [phone]  JEANIA NATER - 66 y.o. female   MRN [id number]   Date of Birth: [birth year]  Date: 12/14/2021   PCP: Doreene Nest, NP   Referral: Doreene Nest, NP  Chief Complaint  Patient presents with   Foot Pain    Right-Seen by Dr. Alphonsus Sias 12/08/21    This visit occurred during the SARS-CoV-2 public health emergency.  Safety protocols were in place, including screening questions prior to the visit, additional usage of staff PPE, and extensive cleaning of exam room while observing appropriate contact time as indicated for disinfecting solutions.   Subjective:   Theresa Bridges is a 66 y.o. very pleasant female patient with Body mass index is 32.79 kg/m. who presents with the following:  R great toe pain on the R.  On further questioning this is really more along the shaft of the great toe, and the first metatarsal.  She not having any pain right now and the MTP joint.  She has not had any kind of intermittent injury.  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  Previously, she was very active and on her feet all day at work, but this has decreased comparatively in the last 6 months.  And intermittently will flareup and down, and for a while she had no pain at all.  Right now and intermittently over the last few months it has been hurting more.  No injury at all.  Retired in July. Started several months ago. Did not have it for a whil.   I pulled up her plain right-sided foot films, and there is no evidence of acute fracture.  Review of Systems is noted in the HPI, as appropriate  Objective:   BP 120/60    Pulse (!) 105    Temp 97.9 F (36.6 C) (Temporal)    Ht 5\' 4"  (1.626 m)    Wt 191 lb (86.6 kg)    SpO2 98%    BMI 32.79 kg/m   GEN: No acute distress; alert,appropriate. PULM: Breathing comfortably in no  respiratory distress PSYCH: Normally interactive.   No tenderness at the hindfoot, tibia, fibula, talus, cuboid, navicular, the entirety and remainder of the midfoot.  No tenderness on the fifth metatarsal.  All MTP joints are nontender including the first MTP joint. I did stress MTP joint in multiple directions, and this was not tender and no tenderness on the plantar aspect and dorsal aspect of the joint.  She does have tenderness throughout the first metatarsal to palpation, but she is not tender in any ligamentous structures, plantar fascia, or EHL.  Laboratory and Imaging Data:  Assessment and Plan:     ICD-10-CM   1. Acute foot pain, right  M79.671      Clinically most consistent with metatarsal stress reaction on the right.  She is getting a new pair of shoes today than her stiff with some good cushioning.  Think this will probably be okay and not for some immobility with cushion.  She is not doing better in 2 weeks, she will call or get a postoperative shoe.  Typically, this will be better after 4 weeks, occasionally at 6 weeks at age 38.  Follow-up: Return in about 1 month (around 01/14/2022) for Dr. 03/14/2022, L foot recheck.  [employer] One speech-to-text software was used  for transcription in this dictation.  Possible transcriptional errors can occur using Animal nutritionist.   Signed,  Elpidio Galea. Valine Drozdowski, MD   Outpatient Encounter Medications as of 12/14/2021  Medication Sig   amLODipine (NORVASC) 10 MG tablet Take 1 tablet (10 mg total) by mouth daily. For blood pressure.   olmesartan (BENICAR) 20 MG tablet Take 1 tablet (20 mg total) by mouth daily. For blood pressure. Due in January 2023 for office visit.   rosuvastatin (CRESTOR) 10 MG tablet Take 1 tablet (10 mg total) by mouth daily. For cholesterol.   venlafaxine XR (EFFEXOR-XR) 150 MG 24 hr capsule Take 1 capsule (150 mg total) by mouth daily with breakfast. For anxiety.   No facility-administered encounter  medications on file as of 12/14/2021.

## 2021-12-20 ENCOUNTER — Other Ambulatory Visit: Payer: Self-pay | Admitting: Primary Care

## 2021-12-20 ENCOUNTER — Other Ambulatory Visit: Payer: Self-pay | Admitting: Family Medicine

## 2021-12-20 DIAGNOSIS — F330 Major depressive disorder, recurrent, mild: Secondary | ICD-10-CM

## 2021-12-20 DIAGNOSIS — I10 Essential (primary) hypertension: Secondary | ICD-10-CM

## 2022-01-13 ENCOUNTER — Encounter: Payer: BC Managed Care – PPO | Admitting: Primary Care

## 2022-01-21 ENCOUNTER — Ambulatory Visit (INDEPENDENT_AMBULATORY_CARE_PROVIDER_SITE_OTHER): Payer: BC Managed Care – PPO | Admitting: Primary Care

## 2022-01-21 ENCOUNTER — Other Ambulatory Visit (HOSPITAL_COMMUNITY)
Admission: RE | Admit: 2022-01-21 | Discharge: 2022-01-21 | Disposition: A | Discharge status: Home / Self Care | Payer: BC Managed Care – PPO | Source: Ambulatory Visit | Attending: Primary Care | Admitting: Primary Care

## 2022-01-21 ENCOUNTER — Encounter: Payer: Self-pay | Admitting: Primary Care

## 2022-01-21 ENCOUNTER — Other Ambulatory Visit: Payer: Self-pay

## 2022-01-21 VITALS — BP 136/88 | HR 102 | Temp 98.6°F | Ht 64.0 in | Wt 190.0 lb

## 2022-01-21 DIAGNOSIS — R7303 Prediabetes: Secondary | ICD-10-CM | POA: Diagnosis not present

## 2022-01-21 DIAGNOSIS — I10 Essential (primary) hypertension: Secondary | ICD-10-CM

## 2022-01-21 DIAGNOSIS — Z1159 Encounter for screening for other viral diseases: Secondary | ICD-10-CM

## 2022-01-21 DIAGNOSIS — Z0000 Encounter for general adult medical examination without abnormal findings: Secondary | ICD-10-CM | POA: Diagnosis not present

## 2022-01-21 DIAGNOSIS — E785 Hyperlipidemia, unspecified: Secondary | ICD-10-CM | POA: Diagnosis not present

## 2022-01-21 DIAGNOSIS — M79671 Pain in right foot: Secondary | ICD-10-CM

## 2022-01-21 DIAGNOSIS — Z124 Encounter for screening for malignant neoplasm of cervix: Secondary | ICD-10-CM

## 2022-01-21 DIAGNOSIS — F419 Anxiety disorder, unspecified: Secondary | ICD-10-CM | POA: Diagnosis not present

## 2022-01-21 DIAGNOSIS — F32A Depression, unspecified: Secondary | ICD-10-CM

## 2022-01-21 DIAGNOSIS — F17200 Nicotine dependence, unspecified, uncomplicated: Secondary | ICD-10-CM

## 2022-01-21 DIAGNOSIS — Z114 Encounter for screening for human immunodeficiency virus [HIV]: Secondary | ICD-10-CM

## 2022-01-21 LAB — COMPREHENSIVE METABOLIC PANEL
ALT: 29 U/L (ref 0–35)
AST: 27 U/L (ref 0–37)
Albumin: 4.2 g/dL (ref 3.5–5.2)
Alkaline Phosphatase: 76 U/L (ref 39–117)
BUN: 13 mg/dL (ref 6–23)
CO2: 29 mEq/L (ref 19–32)
Calcium: 8.9 mg/dL (ref 8.4–10.5)
Chloride: 98 mEq/L (ref 96–112)
Creatinine, Ser: 0.97 mg/dL (ref 0.40–1.20)
GFR: 61.97 mL/min (ref >60.00)
Glucose, Bld: 75 mg/dL (ref 70–99)
Potassium: 4 mEq/L (ref 3.5–5.1)
Sodium: 133 mEq/L — ABNORMAL LOW (ref 135–145)
Total Bilirubin: 0.3 mg/dL (ref 0.2–1.2)
Total Protein: 7.4 g/dL (ref 6.0–8.3)

## 2022-01-21 LAB — LIPID PANEL
Cholesterol: 143 mg/dL (ref 0–200)
HDL: 72.3 mg/dL (ref >39.00)
LDL Cholesterol: 58 mg/dL (ref 0–99)
NonHDL: 70.92
Total CHOL/HDL Ratio: 2
Triglycerides: 66 mg/dL (ref 0.0–149.0)
VLDL: 13.2 mg/dL (ref 0.0–40.0)

## 2022-01-21 LAB — HEMOGLOBIN A1C: Hgb A1c MFr Bld: 6.1 % (ref 4.6–6.5)

## 2022-01-21 NOTE — Assessment & Plan Note (Signed)
Borderline high today.  I recommend she start checking BP at home with her arm cuff and report if readings are consistently at or above 135/90.  Continue olmesartan 20 mg daily. Continue amlodipine 10 mg daily.  CMP pending.

## 2022-01-21 NOTE — Patient Instructions (Signed)
Stop by the lab prior to leaving today. I will notify you of your results once received.   Monitor your blood pressure at home with an arm cuff. Your blood pressure should run less than 135 on top and less than 90 on bottom.  It was a pleasure to see you today!  Preventive Care 15-64 Years Old, Female Preventive care refers to lifestyle choices and visits with your health care provider that can promote health and wellness. Preventive care visits are also called wellness exams. What can I expect for my preventive care visit? Counseling Your health care provider may ask you questions about your: Medical history, including: Past medical problems. Family medical history. Pregnancy history. Current health, including: Menstrual cycle. Method of birth control. Emotional well-being. Home life and relationship well-being. Sexual activity and sexual health. Lifestyle, including: Alcohol, nicotine or tobacco, and drug use. Access to firearms. Diet, exercise, and sleep habits. Work and work Statistician. Sunscreen use. Safety issues such as seatbelt and bike helmet use. Physical exam Your health care provider will check your: Height and weight. These may be used to calculate your BMI (body mass index). BMI is a measurement that tells if you are at a healthy weight. Waist circumference. This measures the distance around your waistline. This measurement also tells if you are at a healthy weight and may help predict your risk of certain diseases, such as type 2 diabetes and high blood pressure. Heart rate and blood pressure. Body temperature. Skin for abnormal spots. What immunizations do I need? Vaccines are usually given at various ages, according to a schedule. Your health care provider will recommend vaccines for you based on your age, medical history, and lifestyle or other factors, such as travel or where you work. What tests do I need? Screening Your health care provider may recommend  screening tests for certain conditions. This may include: Lipid and cholesterol levels. Diabetes screening. This is done by checking your blood sugar (glucose) after you have not eaten for a while (fasting). Pelvic exam and Pap test. Hepatitis B test. Hepatitis C test. HIV (human immunodeficiency virus) test. STI (sexually transmitted infection) testing, if you are at risk. Lung cancer screening. Colorectal cancer screening. Mammogram. Talk with your health care provider about when you should start having regular mammograms. This may depend on whether you have a family history of breast cancer. BRCA-related cancer screening. This may be done if you have a family history of breast, ovarian, tubal, or peritoneal cancers. Bone density scan. This is done to screen for osteoporosis. Talk with your health care provider about your test results, treatment options, and if necessary, the need for more tests. Follow these instructions at home: Eating and drinking  Eat a diet that includes fresh fruits and vegetables, whole grains, lean protein, and low-fat dairy products. Take vitamin and mineral supplements as recommended by your health care provider. Do not drink alcohol if: Your health care provider tells you not to drink. You are pregnant, may be pregnant, or are planning to become pregnant. If you drink alcohol: Limit how much you have to 0-1 drink a day. Know how much alcohol is in your drink. In the [place] one drink equals one 12 oz bottle of beer (355 mL), one 5 oz glass of wine (148 mL), or one 1 oz glass of hard liquor (44 mL). Lifestyle Brush your teeth every morning and night with fluoride toothpaste. Floss one time each day. Exercise for at least 30 minutes 5 or more days each  week. Do not use any products that contain nicotine or tobacco. These products include cigarettes, chewing tobacco, and vaping devices, such as e-cigarettes. If you need help quitting, ask your health care  provider. Do not use drugs. If you are sexually active, practice safe sex. Use a condom or other form of protection to prevent STIs. If you do not wish to become pregnant, use a form of birth control. If you plan to become pregnant, see your health care provider for a prepregnancy visit. Take aspirin only as told by your health care provider. Make sure that you understand how much to take and what form to take. Work with your health care provider to find out whether it is safe and beneficial for you to take aspirin daily. Find healthy ways to manage stress, such as: Meditation, yoga, or listening to music. Journaling. Talking to a trusted person. Spending time with friends and family. Minimize exposure to UV radiation to reduce your risk of skin cancer. Safety Always wear your seat belt while driving or riding in a vehicle. Do not drive: If you have been drinking alcohol. Do not ride with someone who has been drinking. When you are tired or distracted. While texting. If you have been using any mind-altering substances or drugs. Wear a helmet and other protective equipment during sports activities. If you have firearms in your house, make sure you follow all gun safety procedures. Seek help if you have been physically or sexually abused. What's next? Visit your health care provider once a year for an annual wellness visit. Ask your health care provider how often you should have your eyes and teeth checked. Stay up to date on all vaccines. This information is not intended to replace advice given to you by your health care provider. Make sure you discuss any questions you have with your health care provider. Document Revised: 05/20/2021 Document Reviewed: 05/20/2021 Elsevier Patient Education  [place].

## 2022-01-21 NOTE — Assessment & Plan Note (Signed)
Immunizations UTD. Pap smear due, completed today. Colon cancer screening UTD, Cologuard completed in 2022, due 2025. Mammogram UTD.  Discussed the importance of a healthy diet and regular exercise in order for weight loss, and to reduce the risk of further co-morbidity.  Exam today stable. Labs pending.

## 2022-01-21 NOTE — Assessment & Plan Note (Signed)
Smoker for 30+ years, 1/2 PPD mostly. Continues to smoke and has cut back to 3-4 cigarettes daily.  Recommended lung cancer screening program with low dose CT scan. She kindly declines but will think about this.  Commended her on cutting back, encouraged complete cessation.

## 2022-01-21 NOTE — Assessment & Plan Note (Signed)
Continue rosuvastatin 5 mg. °Repeat lipid panel pending.  °

## 2022-01-21 NOTE — Assessment & Plan Note (Signed)
Improving with supportive shoes. Continue to monitor.

## 2022-01-21 NOTE — Assessment & Plan Note (Signed)
Controlled.  Continue venlafaxine ER 150 mg daily. 

## 2022-01-21 NOTE — Progress Notes (Signed)
Subjective:    Patient ID: Theresa Bridges, female    DOB: 09/16/1956, 66 y.o.   MRN: [id number]  HPI  Theresa Bridges is a very pleasant 65 y.o. female who presents today for complete physical and follow up of chronic conditions.  Immunizations: -Tetanus: 2014 -Influenza: Completed this season  -Covid-19: 3 vaccines -Shingles: Completed Shingrix  Diet: Healthier diet Exercise: Walking more often  Eye exam: Completes annually  Dental exam: Completes semi-annually   Pap Smear: Completed in 2017, due today Mammogram: Completed in June 2022 Colonoscopy: Completed Cologuard in July 2022  Lung Cancer Screening: Completed in   She does not check her BP at home.   BP Readings from Last 3 Encounters:  01/21/22 136/88  12/14/21 120/60  12/08/21 140/74   Wt Readings from Last 3 Encounters:  01/21/22 190 lb (86.2 kg)  12/14/21 191 lb (86.6 kg)  12/08/21 190 lb (86.2 kg)       Review of Systems  Constitutional:  Negative for unexpected weight change.  HENT:  Negative for rhinorrhea.   Respiratory:  Negative for cough and shortness of breath.   Cardiovascular:  Negative for chest pain.  Gastrointestinal:  Negative for constipation and diarrhea.  Genitourinary:  Negative for difficulty urinating.  Musculoskeletal:  Positive for arthralgias. Negative for myalgias.  Skin:  Negative for rash.  Allergic/Immunologic: Negative for environmental allergies.  Neurological:  Negative for dizziness and headaches.  Psychiatric/Behavioral:  The patient is not nervous/anxious.         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   Enjoys swimming, gardening, growing herbs, playing piano, reading.   Social Determinants of Health   Financial Resource Strain: Not on file  Food Insecurity: Not on file  Transportation Needs: Not on file  Physical Activity: Not on file  Stress: Not on file  Social Connections: Not on file  Intimate Partner Violence: Not on file    Past Surgical History:  Procedure Laterality Date   KNEE SURGERY     ROTATOR CUFF REPAIR      Family History  Problem Relation Age of Onset   Arthritis Mother    Cancer Mother        breast   Breast cancer Mother 53       65   Arthritis Father    Hypertension Father    Cancer Paternal Grandmother        breast   Breast cancer Paternal Grandmother 51    No Known Allergies  Current  Outpatient Medications on File Prior to Visit  Medication Sig Dispense Refill   amLODipine (NORVASC) 10 MG tablet Take 1 tablet (10 mg total) by mouth daily. For blood pressure. Office visit required for further refills. 30 tablet 0   olmesartan (BENICAR) 20 MG tablet Take 1 tablet (20 mg total) by mouth daily. For blood pressure. Office visit required for further refills. 30 tablet 0   rosuvastatin (CRESTOR) 10 MG tablet Take 1 tablet (10 mg total) by mouth daily. For cholesterol. 90 tablet 3   venlafaxine XR (EFFEXOR-XR) 150 MG 24 hr capsule Take 1 capsule (150 mg total) by mouth daily with breakfast. For anxiety. Office visit required for further refills. 30 capsule 0   No current facility-administered medications on file prior to visit.    BP 136/88    Pulse  (!) 102    Temp 98.6 F (37 C) (Oral)    Ht 5\' 4"  (1.626 m)    Wt 190 lb (86.2 kg)    SpO2 99%    BMI 32.61 kg/m  Objective:   Physical Exam HENT:     Right Ear: Tympanic membrane and ear canal normal.     Left Ear: Tympanic membrane and ear canal normal.     Nose: Nose normal.  Eyes:     Conjunctiva/sclera: Conjunctivae normal.     Pupils: Pupils are equal, round, and reactive to light.  Neck:     Thyroid: No thyromegaly.  Cardiovascular:     Rate and Rhythm: Normal rate and regular rhythm.     Heart sounds: No murmur heard. Pulmonary:     Effort: Pulmonary effort is normal.     Breath sounds: Normal breath sounds. No rales.  Abdominal:     General: Bowel sounds are normal.     Palpations: Abdomen is soft.     Tenderness: There is no abdominal tenderness.  Genitourinary:    Labia:        Right: No tenderness or lesion.        Left: No tenderness or lesion.      Vagina: No vaginal discharge or erythema.     Cervix: No cervical motion tenderness, discharge or cervical bleeding.     Uterus: Normal.      Adnexa: Right adnexa normal and left adnexa normal.  Musculoskeletal:        General: Normal range of motion.     Cervical back: Neck supple.  Lymphadenopathy:     Cervical: No cervical adenopathy.  Skin:    General: Skin is warm and dry.     Findings: No rash.  Neurological:     Mental Status: She is alert and oriented to person, place, and time.     Cranial Nerves: No cranial nerve deficit.     Deep Tendon Reflexes: Reflexes are normal and symmetric.  Psychiatric:        Mood and Affect: Mood normal.          Assessment & Plan:      This visit occurred during the SARS-CoV-2 public health emergency.  Safety protocols were in place, including screening questions prior to the visit, additional usage of staff PPE, and extensive cleaning of exam room while observing appropriate contact time as indicated for disinfecting solutions.

## 2022-01-22 ENCOUNTER — Other Ambulatory Visit: Payer: Self-pay | Admitting: Primary Care

## 2022-01-22 DIAGNOSIS — I10 Essential (primary) hypertension: Secondary | ICD-10-CM

## 2022-01-22 DIAGNOSIS — F330 Major depressive disorder, recurrent, mild: Secondary | ICD-10-CM

## 2022-01-22 LAB — CYTOLOGY - PAP
Adequacy: ABSENT
Comment: NEGATIVE
Diagnosis: NEGATIVE
High risk HPV: NEGATIVE

## 2022-01-22 LAB — HIV ANTIBODY (ROUTINE TESTING W REFLEX): HIV 1&2 Ab, 4th Generation: NONREACTIVE

## 2022-01-22 LAB — HEPATITIS C ANTIBODY
Hepatitis C Ab: NONREACTIVE
SIGNAL TO CUT-OFF: <0.02 (ref <1.00)

## 2022-01-24 DIAGNOSIS — E871 Hypo-osmolality and hyponatremia: Secondary | ICD-10-CM

## 2022-01-29 ENCOUNTER — Other Ambulatory Visit: Payer: Self-pay | Admitting: Primary Care

## 2022-01-29 DIAGNOSIS — I10 Essential (primary) hypertension: Secondary | ICD-10-CM

## 2022-01-31 MED ORDER — AMLODIPINE BESYLATE 10 MG PO TABS: 10.0000 mg | ORAL_TABLET | Freq: Every day | ORAL | 3 refills | Status: DC

## 2022-02-17 ENCOUNTER — Encounter: Payer: Self-pay | Admitting: Primary Care

## 2022-03-10 ENCOUNTER — Other Ambulatory Visit: Payer: BC Managed Care – PPO

## 2022-03-10 ENCOUNTER — Ambulatory Visit: Payer: BC Managed Care – PPO

## 2022-03-15 ENCOUNTER — Other Ambulatory Visit (INDEPENDENT_AMBULATORY_CARE_PROVIDER_SITE_OTHER): Payer: BC Managed Care – PPO

## 2022-03-15 DIAGNOSIS — Z111 Encounter for screening for respiratory tuberculosis: Secondary | ICD-10-CM | POA: Diagnosis not present

## 2022-03-15 DIAGNOSIS — E871 Hypo-osmolality and hyponatremia: Secondary | ICD-10-CM

## 2022-03-15 LAB — BASIC METABOLIC PANEL
BUN: 12 mg/dL (ref 6–23)
CO2: 24 mEq/L (ref 19–32)
Calcium: 9.3 mg/dL (ref 8.4–10.5)
Chloride: 101 mEq/L (ref 96–112)
Creatinine, Ser: 0.84 mg/dL (ref 0.40–1.20)
GFR: 73.57 mL/min (ref >60.00)
Glucose, Bld: 79 mg/dL (ref 70–99)
Potassium: 4.2 mEq/L (ref 3.5–5.1)
Sodium: 136 mEq/L (ref 135–145)

## 2022-03-17 LAB — QUANTIFERON-TB GOLD PLUS
Mitogen-NIL: 8.76 IU/mL
NIL: 0.02 IU/mL
QuantiFERON-TB Gold Plus: NEGATIVE
TB1-NIL: 0 IU/mL
TB2-NIL: 0 IU/mL

## 2022-03-23 ENCOUNTER — Other Ambulatory Visit: Payer: Self-pay | Admitting: Primary Care

## 2022-03-23 DIAGNOSIS — E785 Hyperlipidemia, unspecified: Secondary | ICD-10-CM

## 2022-03-25 ENCOUNTER — Telehealth: Payer: Self-pay | Admitting: Primary Care

## 2022-03-25 NOTE — Telephone Encounter (Signed)
Type of forms received:[employer] health form ? ?Routed GD:JMEQAST T ? ?Paperwork received by : Lesly Rubenstein ? ? ?Individual made aware of 3-5 business day turn around (Y/N):Y ? ?Form completed and patient made aware of charges(Y/N):Y ? ? ?Faxed to :  ? ?Form location: Place in mail box ? ?

## 2022-03-25 NOTE — Telephone Encounter (Signed)
Printed immunizations and placed in your box.  ?

## 2022-03-25 NOTE — Telephone Encounter (Signed)
Completed and placed in Joellen's inbox. 

## 2022-03-26 NOTE — Telephone Encounter (Signed)
Called and lvm detailed message that he form were filled out and ready to be pick up , ask patient to call us back to let us know when she able to p/u forms ?

## 2022-03-31 NOTE — Telephone Encounter (Signed)
Pt has pick up forms ?

## 2022-04-15 NOTE — Telephone Encounter (Signed)
Left message to return call to our office.  ?Sent my chart to call office for appointment.  ?

## 2022-04-15 NOTE — Telephone Encounter (Signed)
Called patient she has been set up for office visit on 04/16/22. No further action needed.  ?

## 2022-04-16 ENCOUNTER — Encounter: Payer: Self-pay | Admitting: Primary Care

## 2022-04-16 ENCOUNTER — Ambulatory Visit: Payer: BC Managed Care – PPO | Admitting: Primary Care

## 2022-04-16 ENCOUNTER — Ambulatory Visit (INDEPENDENT_AMBULATORY_CARE_PROVIDER_SITE_OTHER)
Admission: RE | Admit: 2022-04-16 | Discharge: 2022-04-16 | Disposition: A | Discharge status: Home / Self Care | Payer: BC Managed Care – PPO | Source: Ambulatory Visit | Attending: Primary Care | Admitting: Primary Care

## 2022-04-16 VITALS — BP 124/78 | HR 100 | Temp 98.6°F | Ht 64.0 in | Wt 186.0 lb

## 2022-04-16 DIAGNOSIS — M79641 Pain in right hand: Secondary | ICD-10-CM

## 2022-04-16 NOTE — Patient Instructions (Signed)
Complete xray(s) prior to leaving today. I will notify you of your results once received. ? ?Apply ice for inflammation. ? ?Okay to take naproxen twice daily for pain and inflammation. ? ?It was a pleasure to see you today! ? ?

## 2022-04-16 NOTE — Progress Notes (Signed)
? ?Subjective:  ? ? Patient ID: Theresa Bridges, female    DOB: 05-Dec-1956, 66 y.o.   MRN: [id number] ? ?HPI ? ?[name] is a very pleasant 66 y.o. female with a history of hypertension, hyperlipidemia, tobacco dependence, prediabetes who presents today to discuss hand injury. ? ?She slammed her right hand into her car door three days ago while attempting to close the car door. Since then she's noticed swelling to digits 2-4 with most of her pain to 4th DIP joint.  ? ?She cannot place pressure to the right 4th joint without pain. Her 2nd and 3rd digits are not painful.  ? ?She's applied ice, neosporin to an open abrasion to the right medial tip of 4th digit. ? ?BP Readings from Last 3 Encounters:  ?04/16/22 124/78  ?01/21/22 136/88  ?12/14/21 120/60  ? ? ? ? ?Review of Systems  ?Musculoskeletal:  Positive for arthralgias and joint swelling.  ?Skin:  Positive for wound. Negative for color change.  ? ?   ? ? ?Past Medical History:  ?Diagnosis Date  ? Chicken pox   ? Depression   ? Hypertension   ? Stress incontinence   ? ? ?Social History  ? ?Socioeconomic History  ? Marital status: Married  ?  Spouse name: Not on file  ? Number of children: Not on file  ? Years of education: Not on file  ? Highest education level: Not on file  ?Occupational History  ? Occupation: Middle Engineer, site  ?  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 Smoking status: Every Day  ?  Packs/day: 0.20  ?  Types: Cigarettes  ? Smokeless tobacco: Never  ?Substance and Sexual Activity  ? Alcohol use: Yes  ?  Alcohol/week: 0.0 standard drinks  ?  Comment: 1 to 2 glass of wine daily  ? Drug use: No  ? Sexual activity: Not on file  ?Other Topics Concern  ? Not on file  ?Social History Narrative  ? Married.  ? 3 children.   ? 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  ? Enjoys swimming, gardening, growing herbs, playing piano, reading.  ? ?Social Determinants of Health  ? ?Financial Resource Strain: Not on file  ?Food Insecurity: Not on file  ?Transportation Needs:  Not on file  ?Physical Activity: Not on file  ?Stress: Not on file  ?Social Connections: Not on file  ?Intimate Partner Violence: Not on file  ? ? ?Past Surgical History:  ?Procedure Laterality Date  ? KNEE SURGERY    ? ROTATOR CUFF REPAIR    ? ? ?Family History  ?Problem Relation Age of Onset  ? Arthritis Mother   ? Cancer Mother   ?     breast  ? Breast cancer Mother 58  ?     67  ? Arthritis Father   ? Hypertension Father   ? Cancer Paternal Grandmother   ?     breast  ? Breast cancer Paternal Grandmother 48  ? ? ?No Known Allergies ? ?Current Outpatient Medications on File Prior to Visit  ?Medication Sig Dispense Refill  ? amLODipine (NORVASC) 10 MG tablet Take 1 tablet (10 mg total) by mouth daily. For blood pressure. 90 tablet 3  ? olmesartan (BENICAR) 20 MG tablet Take 1 tablet (20 mg total) by mouth daily. For blood pressure. 90 tablet 3  ? rosuvastatin (CRESTOR) 10 MG tablet TAKE ONE TABLET BY MOUTH DAILY FOR CHOLESTEROL 90 tablet 3  ? venlafaxine XR (EFFEXOR-XR) 150 MG 24 hr capsule  Take 1 capsule (150 mg total) by mouth daily with breakfast. For anxiety 90 capsule 3  ? ?No current facility-administered medications on file prior to visit.  ? ? ?BP 124/78   Pulse 100   Temp 98.6 ?F (37 ?C) (Oral)   Ht 5\' 4"  (1.626 m)   Wt 186 lb (84.4 kg)   SpO2 97%   BMI 31.93 kg/m?  ?Objective:  ? Physical Exam ?Constitutional:   ?   General: She is not in acute distress. ?Musculoskeletal:  ?   Right hand: Swelling, tenderness and bony tenderness present. Decreased range of motion. Normal strength.  ?   Left hand: Normal.  ?   Comments: Moderate swelling to digits 2 through 4 on right hand from MCP joint through DIP joints.  Decrease in range of motion with pain to fourth DIP joint. ?  ?Skin: ?   General: Skin is warm and dry.  ?   Findings: No erythema.  ?   Comments: Moderate swelling to digits 2 through 4 on right hand from MCP joint through DIP joints.  Decrease in range of motion with pain to fourth DIP  joint. ? ?Small abrasion to tip of medial right fourth digit.  ? ? ? ? ? ?   ?Assessment & Plan:  ? ? ? ? ?This visit occurred during the SARS-CoV-2 public health emergency.  Safety protocols were in place, including screening questions prior to the visit, additional usage of staff PPE, and extensive cleaning of exam room while observing appropriate contact time as indicated for disinfecting solutions.  ?

## 2022-04-16 NOTE — Assessment & Plan Note (Signed)
Posttraumatic x3 days ago. ? ?Checking plain films today to evaluate for fracture. ? ?Wound does not appear to be infected, continue home care treatment. ?Discussed use of NSAID medication for inflammation and pain. ?Ice as needed. ? ?Await results.  ?

## 2022-04-28 ENCOUNTER — Encounter: Payer: Self-pay | Admitting: Primary Care

## 2022-04-28 ENCOUNTER — Ambulatory Visit: Payer: BC Managed Care – PPO | Admitting: Primary Care

## 2022-04-28 VITALS — BP 110/58 | HR 66 | Temp 97.7°F | Ht 64.0 in | Wt 186.0 lb

## 2022-04-28 DIAGNOSIS — M25541 Pain in joints of right hand: Secondary | ICD-10-CM

## 2022-04-28 DIAGNOSIS — M25542 Pain in joints of left hand: Secondary | ICD-10-CM | POA: Diagnosis not present

## 2022-04-28 LAB — SEDIMENTATION RATE: Sed Rate: 54 mm/hr — ABNORMAL HIGH (ref 0–30)

## 2022-04-28 LAB — C-REACTIVE PROTEIN: CRP: <1 mg/dL (ref 0.5–20.0)

## 2022-04-28 LAB — URIC ACID: Uric Acid, Serum: 6.8 mg/dL (ref 2.4–7.0)

## 2022-04-28 NOTE — Progress Notes (Signed)
Subjective:    Patient ID: Theresa Bridges, female    DOB: 01-May-1956, 66 y.o.   MRN: [id number]  HPI  Theresa Bridges is a very pleasant 66 y.o. female with a history of hypertension, prediabetes, hyperlipidemia, tobacco dependence, acute post traumatic right hand pain who presents today to for follow up of recent hand xrays.  She was last evaluated on 04/16/22 for post traumatic right hand pain at the 2-4 th digits after slamming her hand into the car door while closing. Xrays did not reveal fracture, but did show moderate multifocal osteoarthritis and "possible small periarticular erosions of the second digit at the DIP joint suggesting inflammatory arthropathy".   Today she has noticed improvement in swelling and ROM to her right fingers since her trauma. 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 She does notice intermittent right plantar foot pain, "flares up every 2-3 months".   She has a family history of rheumatoid arthritis in her father. She denies other joint aches. She is a retired Pharmacist, hospital, typed a lot. Also played piano for 30+ years, sometimes 8 hours daily.     Review of Systems  Musculoskeletal:  Positive for arthralgias and joint swelling.  Skin:  Negative for color change.        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   Enjoys swimming, gardening, growing herbs, playing piano, reading.   Social Determinants of Health   Financial Resource Strain: Not on file  Food Insecurity: Not on file  Transportation Needs: Not on file  Physical Activity: Not on file  Stress: Not on file  Social Connections: Not on file  Intimate Partner Violence: Not on file    Past Surgical History:  Procedure Laterality Date   KNEE SURGERY     ROTATOR CUFF REPAIR      Family History  Problem Relation Age of Onset   Arthritis Mother    Cancer Mother        breast   Breast cancer Mother 4       80   Arthritis Father    Hypertension Father    Cancer Paternal Grandmother        breast   Breast cancer Paternal Grandmother 26    No Known Allergies  Current Outpatient Medications on File Prior to Visit  Medication Sig Dispense Refill   amLODipine (NORVASC) 10  MG tablet Take 1 tablet (10 mg total) by mouth daily. For blood pressure. 90 tablet 3   olmesartan (BENICAR) 20 MG tablet Take 1 tablet (20 mg total) by mouth daily. For blood pressure. 90 tablet 3   rosuvastatin (CRESTOR) 10 MG tablet TAKE ONE TABLET BY MOUTH DAILY FOR CHOLESTEROL 90 tablet 3   venlafaxine XR (EFFEXOR-XR) 150 MG 24 hr capsule Take 1 capsule (150 mg total) by mouth daily with breakfast. For anxiety 90 capsule 3   No current facility-administered medications on file prior to visit.    BP (!) 110/58   Pulse 66   Temp 97.7 F (36.5 C) (Oral)   Ht 5\' 4"  (1.626 m)   Wt 186 lb (84.4 kg)   SpO2 97%   BMI 31.93 kg/m  Objective:   Physical Exam Constitutional:      General: She is not in acute distress. Pulmonary:     Effort: Pulmonary effort is normal.  Musculoskeletal:     Comments: Heberden's nodes  noted to bilateral hands at DIP joints.  Right: digits 2-4 Left: digits 2-3  Decrease in ROM at dip joints with Heberden's nodes. Reduced swelling to digits 2-4 from traumatic injury compared to last visit   Neurological:     Mental Status: She is alert.          Assessment & Plan:

## 2022-04-28 NOTE — Assessment & Plan Note (Addendum)
Evidence of osteoarthritis to bilateral hands on exam, also with arthritis to right hand on xray.  Will evaluate for rheumatoid arthritis given erosion seen on recent xray.  Labs pending for RA, CCP, CRP, Sed rate.

## 2022-04-28 NOTE — Patient Instructions (Signed)
Stop by the lab prior to leaving today. I will notify you of your results once received.   It was a pleasure to see you today!  

## 2022-04-29 LAB — RHEUMATOID FACTOR: Rheumatoid fact SerPl-aCnc: <14 IU/mL (ref <14)

## 2022-04-29 LAB — CYCLIC CITRUL PEPTIDE ANTIBODY, IGG: Cyclic Citrullin Peptide Ab: <16 UNITS

## 2022-08-23 ENCOUNTER — Other Ambulatory Visit: Payer: Self-pay | Admitting: Primary Care

## 2022-08-23 DIAGNOSIS — Z1231 Encounter for screening mammogram for malignant neoplasm of breast: Secondary | ICD-10-CM

## 2022-09-06 ENCOUNTER — Ambulatory Visit
Admission: RE | Admit: 2022-09-06 | Discharge: 2022-09-06 | Disposition: A | Discharge status: Home / Self Care | Payer: BC Managed Care – PPO | Source: Ambulatory Visit | Attending: Primary Care | Admitting: Primary Care

## 2022-09-06 DIAGNOSIS — Z1231 Encounter for screening mammogram for malignant neoplasm of breast: Secondary | ICD-10-CM | POA: Insufficient documentation

## 2023-02-04 ENCOUNTER — Ambulatory Visit (INDEPENDENT_AMBULATORY_CARE_PROVIDER_SITE_OTHER): Payer: Medicare PPO | Admitting: Primary Care

## 2023-02-04 ENCOUNTER — Encounter: Payer: Self-pay | Admitting: Primary Care

## 2023-02-04 VITALS — BP 138/78 | HR 60 | Temp 98.2°F | Ht 64.0 in | Wt 185.0 lb

## 2023-02-04 DIAGNOSIS — R7303 Prediabetes: Secondary | ICD-10-CM | POA: Diagnosis not present

## 2023-02-04 DIAGNOSIS — Z0000 Encounter for general adult medical examination without abnormal findings: Secondary | ICD-10-CM

## 2023-02-04 DIAGNOSIS — E2839 Other primary ovarian failure: Secondary | ICD-10-CM

## 2023-02-04 DIAGNOSIS — I10 Essential (primary) hypertension: Secondary | ICD-10-CM

## 2023-02-04 DIAGNOSIS — F32A Depression, unspecified: Secondary | ICD-10-CM

## 2023-02-04 DIAGNOSIS — F419 Anxiety disorder, unspecified: Secondary | ICD-10-CM | POA: Diagnosis not present

## 2023-02-04 DIAGNOSIS — E785 Hyperlipidemia, unspecified: Secondary | ICD-10-CM

## 2023-02-04 DIAGNOSIS — Z1231 Encounter for screening mammogram for malignant neoplasm of breast: Secondary | ICD-10-CM

## 2023-02-04 LAB — CBC
HCT: 37.1 % (ref 36.0–46.0)
Hemoglobin: 12.7 g/dL (ref 12.0–15.0)
MCHC: 34.2 g/dL (ref 30.0–36.0)
MCV: 98.6 fl (ref 78.0–100.0)
Platelets: 316 10*3/uL (ref 150.0–400.0)
RBC: 3.76 Mil/uL — ABNORMAL LOW (ref 3.87–5.11)
RDW: 12.5 % (ref 11.5–15.5)
WBC: 6.7 10*3/uL (ref 4.0–10.5)

## 2023-02-04 LAB — COMPREHENSIVE METABOLIC PANEL
ALT: 40 U/L — ABNORMAL HIGH (ref 0–35)
AST: 39 U/L — ABNORMAL HIGH (ref 0–37)
Albumin: 4.3 g/dL (ref 3.5–5.2)
Alkaline Phosphatase: 70 U/L (ref 39–117)
BUN: 20 mg/dL (ref 6–23)
CO2: 27 mEq/L (ref 19–32)
Calcium: 9.7 mg/dL (ref 8.4–10.5)
Chloride: 98 mEq/L (ref 96–112)
Creatinine, Ser: 0.91 mg/dL (ref 0.40–1.20)
GFR: 66.42 mL/min (ref >60.00)
Glucose, Bld: 81 mg/dL (ref 70–99)
Potassium: 4.8 mEq/L (ref 3.5–5.1)
Sodium: 134 mEq/L — ABNORMAL LOW (ref 135–145)
Total Bilirubin: 0.3 mg/dL (ref 0.2–1.2)
Total Protein: 7.6 g/dL (ref 6.0–8.3)

## 2023-02-04 LAB — LIPID PANEL
Cholesterol: 158 mg/dL (ref 0–200)
HDL: 84 mg/dL (ref >39.00)
LDL Cholesterol: 61 mg/dL (ref 0–99)
NonHDL: 73.66
Total CHOL/HDL Ratio: 2
Triglycerides: 62 mg/dL (ref 0.0–149.0)
VLDL: 12.4 mg/dL (ref 0.0–40.0)

## 2023-02-04 LAB — HEMOGLOBIN A1C: Hgb A1c MFr Bld: 6 % (ref 4.6–6.5)

## 2023-02-04 NOTE — Assessment & Plan Note (Signed)
Controlled, continue venlafaxine ER 150 mg daily.  Continue to monitor.

## 2023-02-04 NOTE — Assessment & Plan Note (Signed)
Repeat lipid panel pending.  Discussed the importance of a healthy diet and regular exercise in order for weight loss, and to reduce the risk of further co-morbidity. Continue rosuvastatin 10 mg daily.

## 2023-02-04 NOTE — Progress Notes (Signed)
Subjective:    Theresa Bridges is a 67 y.o. female who presents for a Welcome to Medicare exam.   BP Readings from Last 3 Encounters:  02/04/23 138/78  04/28/22 (!) 110/58  04/16/22 124/78    Immunizations: -Tetanus: Completed in 2017 -Influenza: Completed this season -Shingles: Completed Shingrix series -Pneumonia: Never completed. Due for Prevnar 20. Declines.    Mammogram: Completed in October 2023   Colonoscopy: Completed Cologuard in 2022, negative. Dexa: Never completed Lung Cancer Screening: never completed, declined last year. Declines today.      Review of Systems  HENT:  Negative for congestion.   Respiratory:  Negative for shortness of breath.   Cardiovascular:  Negative for chest pain.  Gastrointestinal:  Negative for blood in stool and constipation.  Musculoskeletal:  Negative for falls.  Skin:  Negative for rash.  Neurological:  Negative for headaches.  Psychiatric/Behavioral:  The patient is not nervous/anxious.             Objective:    Today's Vitals   02/04/23 1032  BP: 138/78  Pulse: 60  Temp: 98.2 F (36.8 C)  TempSrc: Temporal  SpO2: 99%  Weight: 185 lb (83.9 kg)  Height: '5\' 4"'$  (1.626 m)  Body mass index is 31.76 kg/m.  Medications Outpatient Encounter Medications as of 02/04/2023  Medication Sig   amLODipine (NORVASC) 10 MG tablet Take 1 tablet (10 mg total) by mouth daily. For blood pressure.   olmesartan (BENICAR) 20 MG tablet Take 1 tablet (20 mg total) by mouth daily. For blood pressure.   rosuvastatin (CRESTOR) 10 MG tablet TAKE ONE TABLET BY MOUTH DAILY FOR CHOLESTEROL   venlafaxine XR (EFFEXOR-XR) 150 MG 24 hr capsule Take 1 capsule (150 mg total) by mouth daily with breakfast. For anxiety   No facility-administered encounter medications on file as of 02/04/2023.     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  Substance and Sexual Activity   Alcohol use: Yes    Alcohol/week: 8.0 standard drinks of alcohol    Types: 8 Glasses of wine per week    Comment: 1 to 2 glass of wine daily   Drug use: Never   Sexual activity: Not on file    Tobacco Counseling Ready to quit: No Counseling given: Not Answered Tobacco comments: No comment.   Immunizations and Health Maintenance Immunization History  Administered Date(s) Administered   Influenza, Seasonal, Injecte, Preservative Fre  09/15/2018   Influenza,inj,Quad PF,6+ Mos 09/11/2019   Influenza-Unspecified 09/05/2020, 09/05/2021, 09/09/2022   PFIZER(Purple Top)SARS-COV-2 Vaccination 02/02/2020, 03/01/2020, 09/11/2020   Td 03/15/2016   Zoster Recombinat (Shingrix) 12/19/2020, 06/15/2021   There are no preventive care reminders to display for this patient.  Activities of Daily Living    02/04/2023   10:29 AM  In your present state of health, do you have any difficulty performing the following activities:  Hearing? 0  Vision? 0  Difficulty concentrating or making decisions? 0  Walking or climbing stairs? 0  Dressing or bathing? 0  Doing errands, shopping? 0    Physical Exam HENT:     Right Ear: Tympanic membrane and ear canal normal.     Left Ear: Tympanic membrane and ear canal normal.      Nose: Nose normal.  Eyes:     Conjunctiva/sclera: Conjunctivae normal.     Pupils: Pupils are equal, round, and reactive to light.  Neck:     Thyroid: No thyromegaly.  Cardiovascular:     Rate and Rhythm: Normal rate and regular rhythm.     Heart sounds: No murmur heard. Pulmonary:     Effort: Pulmonary effort is normal.     Breath sounds: Normal breath sounds. No rales.  Abdominal:     General: Bowel sounds are normal.     Palpations: Abdomen is soft.     Tenderness: There is no abdominal tenderness.  Musculoskeletal:        General: Normal range of motion.     Cervical back: Neck supple.  Lymphadenopathy:     Cervical: No cervical adenopathy.  Skin:    General: Skin is warm and dry.     Findings: No rash.  Neurological:     Mental Status: She is alert and oriented to person, place, and time.     Cranial Nerves: No cranial nerve deficit.     Deep Tendon Reflexes: Reflexes are normal and symmetric.  Psychiatric:        Mood and Affect: Mood normal.     ECG today with NSR, rate of 89.  No PAC/PVC, no acute ST changes.  No old EKG to compare.  Advanced Directives: N/A.      Assessment:    This is a routine wellness examination for this patient .   Vision/Hearing screen Hearing Screening   '500Hz'$  '1000Hz'$  '2000Hz'$  '4000Hz'$   Right ear 40 40 25 25  Left ear 25 40 25 25    Dietary issues and exercise activities discussed:      Goals   None    Depression Screen    02/04/2023   10:28 AM 04/28/2022   10:50 AM 01/21/2022    8:06 AM 12/19/2020   10:20 AM  PHQ 2/9 Scores  PHQ - 2 Score 0 0 0 6  PHQ- 9 Score 0 0 0 24     Fall Risk    02/04/2023   10:28 AM  Fall Risk   Falls in the past year? 0  Number falls in past yr: 0  Injury with Fall? 0  Risk for fall due to : No Fall Risks  Follow up Falls evaluation completed    Cognitive Function:        02/04/2023   10:29 AM  6CIT Screen  What Year? 0 points  What month? 0 points  What time? 0 points  Count back  from 20 0 points  Months in reverse 0 points  Repeat phrase 0 points  Total Score  0 points    Patient Care Team: Pleas Koch, NP as PCP - General (Nurse Practitioner)     Plan:   See problem based charting.  I have personally reviewed and noted the following in the patient's chart:   Medical and social history Use of alcohol, tobacco or illicit drugs  Current medications and supplements Functional ability and status Nutritional status Physical activity Advanced directives List of other physicians Hospitalizations, surgeries, and ER visits in previous 12 months Vitals Screenings to include cognitive, depression, and falls Referrals and appointments  In addition, I have reviewed and discussed with patient certain preventive protocols, quality metrics, and best practice recommendations. A written personalized care plan for preventive services as well as general preventive health recommendations were provided to patient.     Pleas Koch, NP 02/04/2023

## 2023-02-04 NOTE — Assessment & Plan Note (Signed)
Repeat A1C pending.  Discussed the importance of a healthy diet and regular exercise in order for weight loss, and to reduce the risk of further co-morbidity.

## 2023-02-04 NOTE — Assessment & Plan Note (Addendum)
Declines Prevnar 20 today. Bone density scan due, orders placed.  Mammogram UTD. Colon cancer screening UTD, due 2025 Declines lung cancer screening.  I have personally reviewed and noted the following in the patient's chart:   Medical and social history Use of alcohol, tobacco or illicit drugs  Current medications and supplements Functional ability and status Nutritional status Physical activity Advanced directives List of other physicians Hospitalizations, surgeries, and ER visits in previous 12 months Vitals Screenings to include cognitive, depression, and falls Referrals and appointments  In addition, I have reviewed and discussed with patient certain preventive protocols, quality metrics, and best practice recommendations. A written personalized care plan for preventive services as well as general preventive health recommendations were provided to patient.

## 2023-02-04 NOTE — Assessment & Plan Note (Signed)
Overall controlled.  Continue amlodipine 10 mg daily and olmesartan 20 mg daily. CMP pending.

## 2023-02-10 ENCOUNTER — Other Ambulatory Visit: Payer: Self-pay | Admitting: Primary Care

## 2023-02-10 DIAGNOSIS — F330 Major depressive disorder, recurrent, mild: Secondary | ICD-10-CM

## 2023-02-10 DIAGNOSIS — I10 Essential (primary) hypertension: Secondary | ICD-10-CM

## 2023-04-28 ENCOUNTER — Other Ambulatory Visit: Payer: Self-pay | Admitting: Primary Care

## 2023-04-28 DIAGNOSIS — E785 Hyperlipidemia, unspecified: Secondary | ICD-10-CM

## 2023-05-09 IMAGING — DX DG FOOT COMPLETE 3+V*R*
3 series · 3 of 3 positions shown · non-contrast
Comparison: None.

CLINICAL DATA: Pain

EXAM:
RIGHT FOOT COMPLETE - 3+ VIEW

[foot ap]
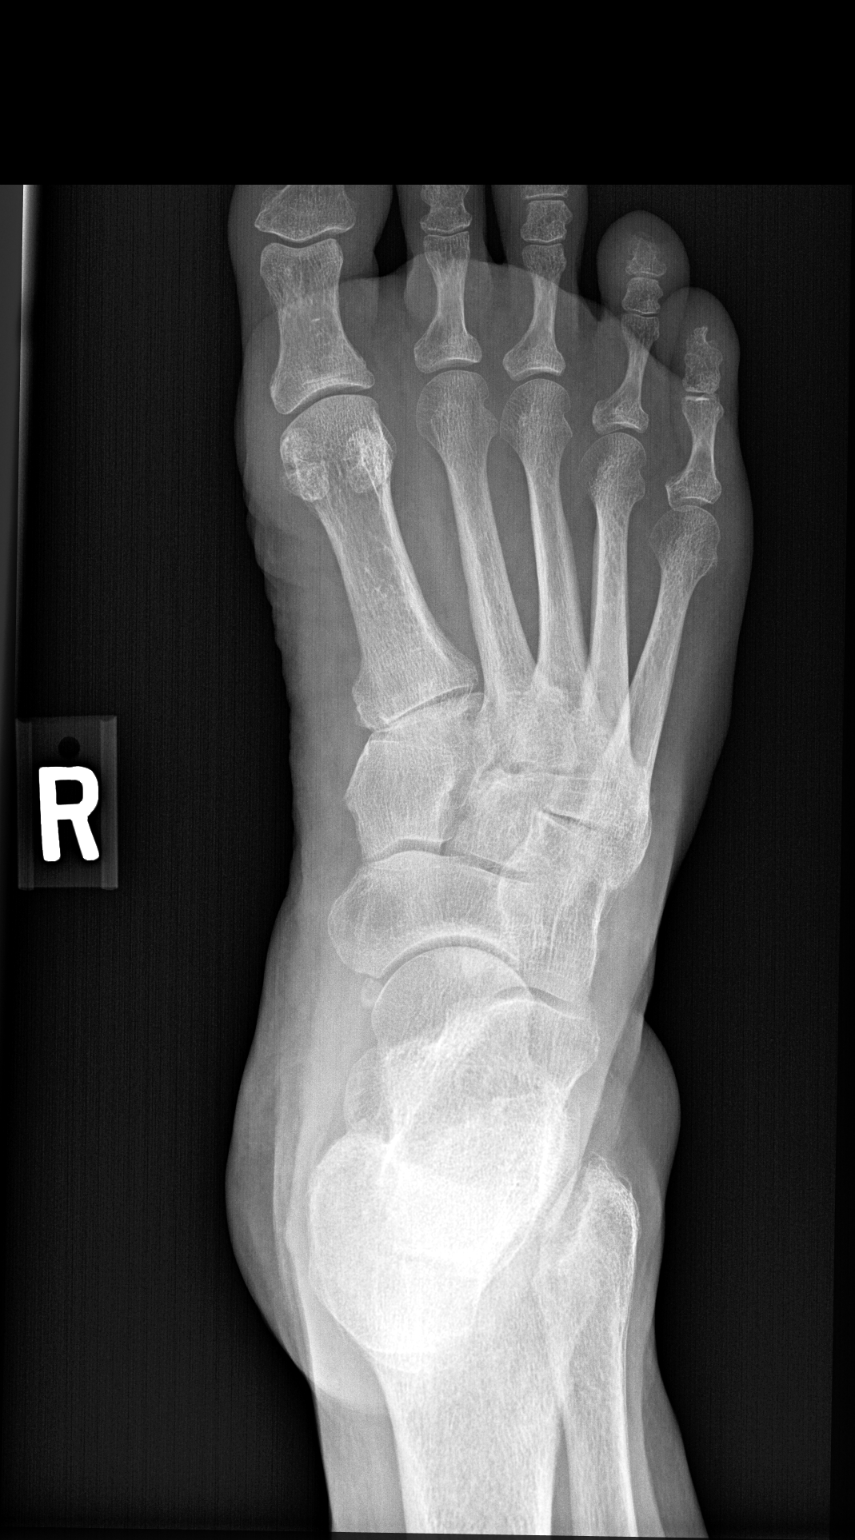

[foot obl (oblique)]
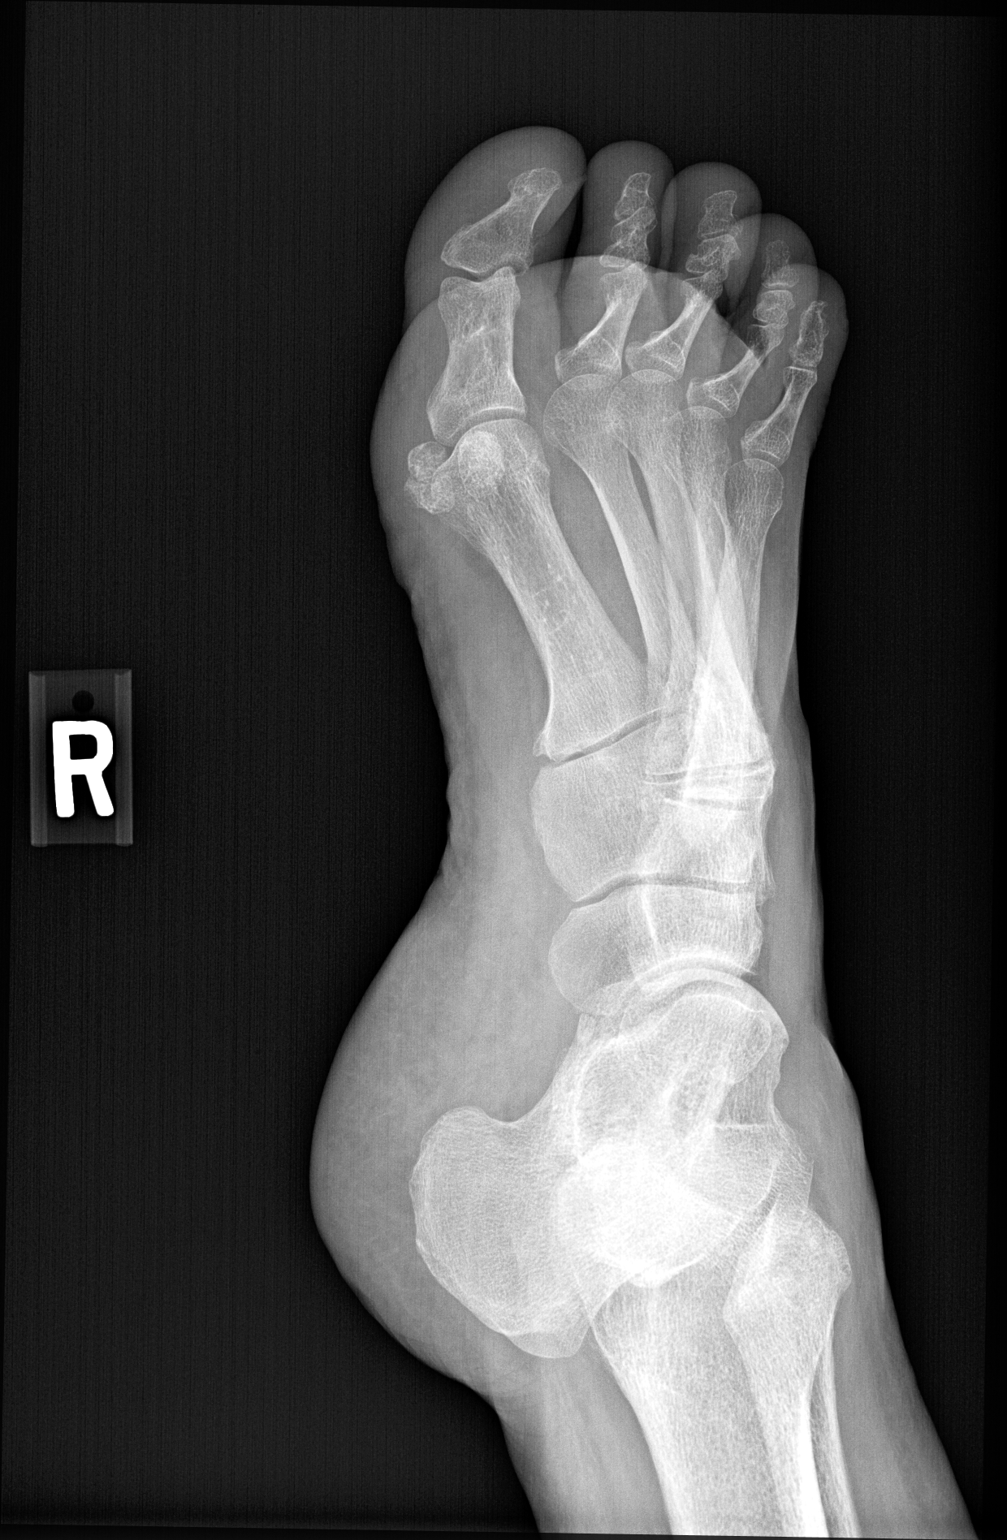

[foot lat]
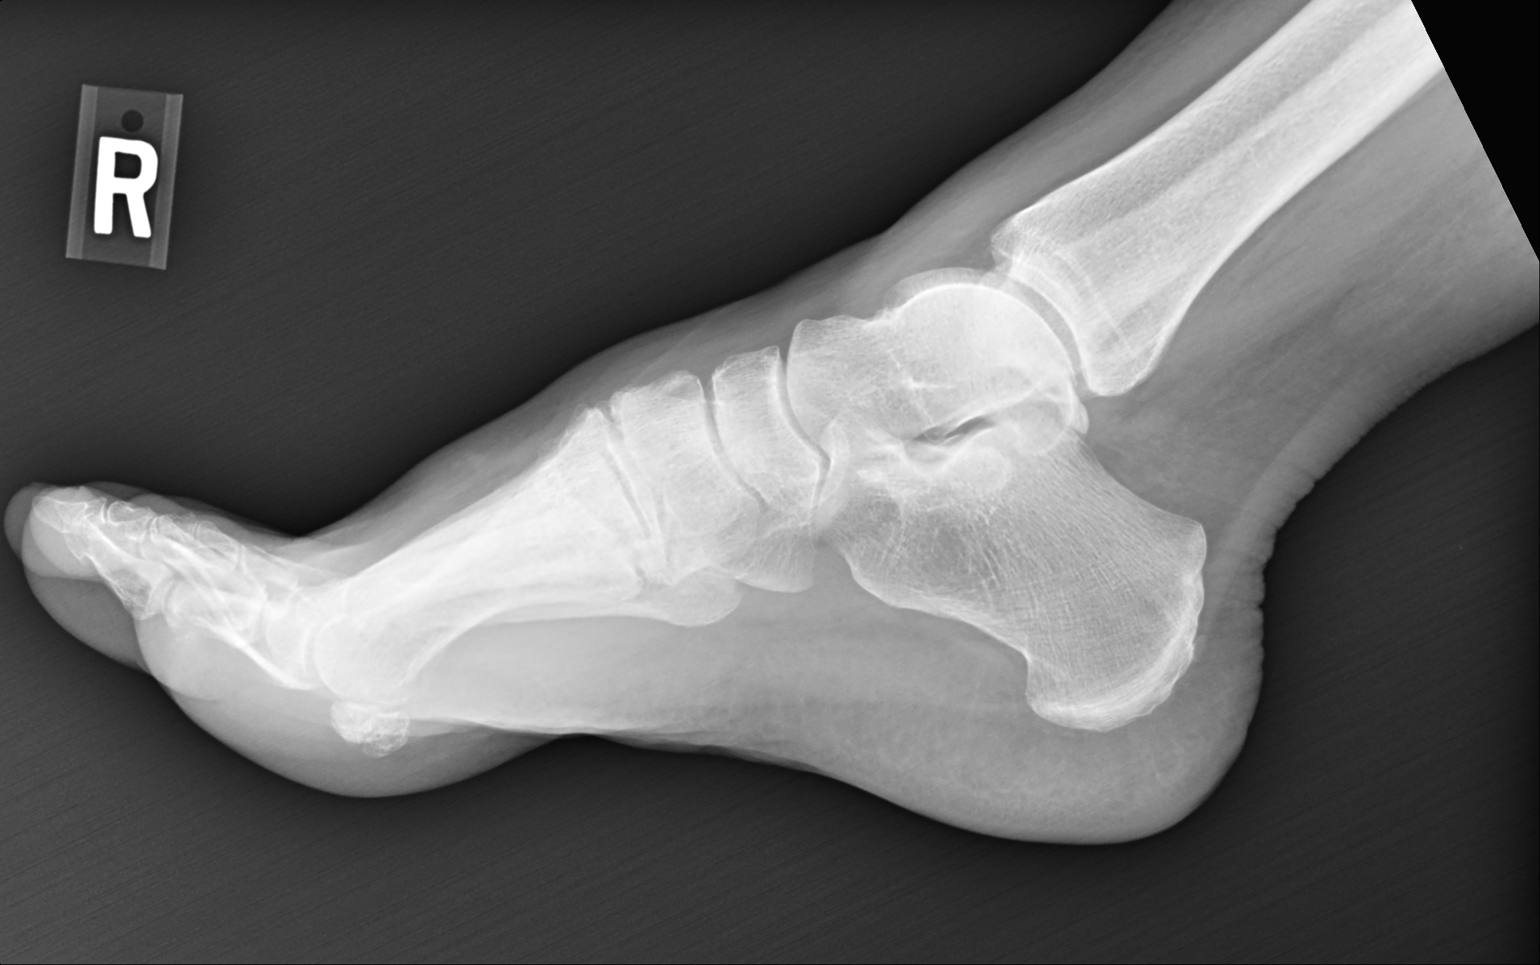

[3 of 3 positions shown; findings below may reference images not displayed]

FINDINGS: No recent fracture or dislocation is seen. Are no focal lytic
lesions. Bony spurs are noted in the dorsal aspect of
tarsometatarsal joints in the lateral view.
IMPRESSION: No recent fracture or dislocation is seen in the right foot.
Degenerative changes with bony spurs are noted in the
tarsometatarsal joints.

## 2023-09-08 ENCOUNTER — Ambulatory Visit
Admission: RE | Admit: 2023-09-08 | Discharge: 2023-09-08 | Disposition: A | Discharge status: Home / Self Care | Payer: Medicare PPO | Source: Ambulatory Visit | Attending: Primary Care | Admitting: Primary Care

## 2023-09-08 DIAGNOSIS — Z1231 Encounter for screening mammogram for malignant neoplasm of breast: Secondary | ICD-10-CM | POA: Insufficient documentation

## 2024-01-04 NOTE — Telephone Encounter (Signed)
LVM to c/b and r/s appt.

## 2024-02-07 ENCOUNTER — Encounter: Payer: Medicare PPO | Admitting: Primary Care

## 2024-02-11 ENCOUNTER — Other Ambulatory Visit: Payer: Self-pay | Admitting: Primary Care

## 2024-02-11 DIAGNOSIS — E785 Hyperlipidemia, unspecified: Secondary | ICD-10-CM

## 2024-02-11 NOTE — Telephone Encounter (Signed)
 Patient is overdue for an office visit. She is scheduled for June, needs to be rescheduled for ASAP. Thanks!

## 2024-02-13 NOTE — Telephone Encounter (Signed)
 LVM to see patient ASAP per provider request

## 2024-02-16 NOTE — Telephone Encounter (Signed)
Patient scheduled 03/28

## 2024-02-16 NOTE — Telephone Encounter (Signed)
 Theresa Bridges, will you call her to get her added to the schedule for 03/28?

## 2024-02-17 DIAGNOSIS — E2839 Other primary ovarian failure: Secondary | ICD-10-CM

## 2024-02-22 ENCOUNTER — Other Ambulatory Visit: Payer: Self-pay | Admitting: Primary Care

## 2024-02-22 DIAGNOSIS — F330 Major depressive disorder, recurrent, mild: Secondary | ICD-10-CM

## 2024-02-22 DIAGNOSIS — I10 Essential (primary) hypertension: Secondary | ICD-10-CM

## 2024-03-01 ENCOUNTER — Ambulatory Visit
Admission: RE | Admit: 2024-03-01 | Discharge: 2024-03-01 | Disposition: A | Discharge status: Home / Self Care | Source: Ambulatory Visit | Attending: Primary Care | Admitting: Primary Care

## 2024-03-01 DIAGNOSIS — E2839 Other primary ovarian failure: Secondary | ICD-10-CM | POA: Insufficient documentation

## 2024-03-01 DIAGNOSIS — Z780 Asymptomatic menopausal state: Secondary | ICD-10-CM | POA: Diagnosis not present

## 2024-03-01 DIAGNOSIS — M85832 Other specified disorders of bone density and structure, left forearm: Secondary | ICD-10-CM | POA: Diagnosis not present

## 2024-03-02 ENCOUNTER — Ambulatory Visit: Admitting: Primary Care

## 2024-03-02 ENCOUNTER — Encounter: Payer: Self-pay | Admitting: Primary Care

## 2024-03-02 VITALS — BP 132/80 | HR 108 | Temp 98.4°F | Ht 64.0 in | Wt 176.0 lb

## 2024-03-02 DIAGNOSIS — Z1211 Encounter for screening for malignant neoplasm of colon: Secondary | ICD-10-CM

## 2024-03-02 DIAGNOSIS — F419 Anxiety disorder, unspecified: Secondary | ICD-10-CM | POA: Diagnosis not present

## 2024-03-02 DIAGNOSIS — Z0000 Encounter for general adult medical examination without abnormal findings: Secondary | ICD-10-CM

## 2024-03-02 DIAGNOSIS — F32A Depression, unspecified: Secondary | ICD-10-CM | POA: Diagnosis not present

## 2024-03-02 DIAGNOSIS — R7303 Prediabetes: Secondary | ICD-10-CM

## 2024-03-02 DIAGNOSIS — E785 Hyperlipidemia, unspecified: Secondary | ICD-10-CM

## 2024-03-02 DIAGNOSIS — I10 Essential (primary) hypertension: Secondary | ICD-10-CM | POA: Diagnosis not present

## 2024-03-02 LAB — LIPID PANEL
Cholesterol: 180 mg/dL (ref 0–200)
HDL: 90.1 mg/dL (ref >39.00)
LDL Cholesterol: 71 mg/dL (ref 0–99)
NonHDL: 89.83
Total CHOL/HDL Ratio: 2
Triglycerides: 92 mg/dL (ref 0.0–149.0)
VLDL: 18.4 mg/dL (ref 0.0–40.0)

## 2024-03-02 LAB — COMPREHENSIVE METABOLIC PANEL WITH GFR
ALT: 31 U/L (ref 0–35)
AST: 29 U/L (ref 0–37)
Albumin: 4.7 g/dL (ref 3.5–5.2)
Alkaline Phosphatase: 73 U/L (ref 39–117)
BUN: 21 mg/dL (ref 6–23)
CO2: 24 meq/L (ref 19–32)
Calcium: 9.7 mg/dL (ref 8.4–10.5)
Chloride: 99 meq/L (ref 96–112)
Creatinine, Ser: 0.9 mg/dL (ref 0.40–1.20)
GFR: 66.8 mL/min (ref >60.00)
Glucose, Bld: 75 mg/dL (ref 70–99)
Potassium: 4.8 meq/L (ref 3.5–5.1)
Sodium: 137 meq/L (ref 135–145)
Total Bilirubin: 0.4 mg/dL (ref 0.2–1.2)
Total Protein: 7.7 g/dL (ref 6.0–8.3)

## 2024-03-02 LAB — HEMOGLOBIN A1C: Hgb A1c MFr Bld: 6 % (ref 4.6–6.5)

## 2024-03-02 NOTE — Progress Notes (Signed)
 Subjective:    Patient ID: Theresa Bridges, female    DOB: 1956/02/12, 68 y.o.   MRN: [id number]  HPI  Theresa Bridges is a very pleasant 68 y.o. female who presents today for complete physical and follow up of chronic conditions.  Immunizations: -Tetanus: Completed in 2017 -Influenza: Completed this season  -Shingles: Completed Shingrix series -Pneumonia: Completed Prevnar 20 in 2024  Diet: Fair diet.  Exercise: No regular exercise.  Eye exam: Completes annually  Dental exam: Completes regularly   Mammogram: Completed in October 2024 Bone Density Scan: Completed yesterday   Colonoscopy: Completed in Cologuard in July 2022 Lung Cancer Screening: Never completed, declined last year  BP Readings from Last 3 Encounters:  03/02/24 132/80  02/04/23 138/78  04/28/22 (!) 110/58       Review of Systems  Constitutional:  Negative for unexpected weight change.  HENT:  Negative for rhinorrhea.   Respiratory:  Negative for cough and shortness of breath.   Cardiovascular:  Negative for chest pain.  Gastrointestinal:  Negative for constipation and diarrhea.  Genitourinary:  Negative for difficulty urinating.  Musculoskeletal:  Negative for arthralgias and myalgias.  Skin:  Negative for rash.  Allergic/Immunologic: Negative for environmental allergies.  Neurological:  Negative for dizziness and headaches.  Psychiatric/Behavioral:  The patient is not nervous/anxious.          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  Substance and Sexual Activity   Alcohol use: Yes    Alcohol/week: 8.0 standard drinks of alcohol    Types: 8 Glasses of wine per week    Comment: 1 to 2 glass of wine daily   Drug use: Never   Sexual activity: Not on file  Other Topics Concern   Not on file  Social History Narrative   Married.   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   Enjoys swimming, gardening, growing herbs, playing piano, reading.   Social Drivers of Corporate investment banker Strain: Not on file  Food Insecurity: Not on file  Transportation Needs: Not on file  Physical Activity: Not on file  Stress: Not on file  Social Connections: Not on file  Intimate Partner Violence: Not on file    Past Surgical History:  Procedure Laterality Date   KNEE SURGERY     ROTATOR CUFF REPAIR      Family History  Problem Relation Age of Onset   Arthritis Mother    Cancer Mother        breast   Breast cancer Mother 43       46   Arthritis Father    Hypertension Father    Cancer Paternal Grandmother  breast   Breast cancer Paternal Grandmother 86    No Known Allergies  Current Outpatient Medications on File Prior to Visit  Medication Sig Dispense Refill   amLODipine (NORVASC) 10 MG tablet TAKE ONE TABLET BY MOUTH DAILY FOR BLOOD PRESSURE 90 tablet 0   olmesartan (BENICAR) 20 MG tablet TAKE 1 TABLET BY MOUTH DAILY FOR FOR BLOOD PRESSURE 90 tablet 0   rosuvastatin (CRESTOR) 10 MG tablet TAKE 1 TABLET BY MOUTH DAILY FOR CHOLESTEROL 30 tablet 0   venlafaxine XR (EFFEXOR-XR) 150 MG 24 hr capsule TAKE 1 CAPSULE BY MOUTH DAILY WITH BREAKFAST FOR ANXIETY 90 capsule 0   No current facility-administered medications on file prior to visit.    BP 132/80   Pulse (!) 108   Temp 98.4 F (36.9 C) (Temporal)   Ht 5\' 4"  (1.626 m)   Wt 176 lb (79.8 kg)   SpO2 98%    BMI 30.21 kg/m  Objective:   Physical Exam HENT:     Right Ear: Tympanic membrane and ear canal normal.     Left Ear: Tympanic membrane and ear canal normal.  Eyes:     Pupils: Pupils are equal, round, and reactive to light.  Cardiovascular:     Rate and Rhythm: Normal rate and regular rhythm.  Pulmonary:     Effort: Pulmonary effort is normal.     Breath sounds: Normal breath sounds.  Abdominal:     General: Bowel sounds are normal.     Palpations: Abdomen is soft.     Tenderness: There is no abdominal tenderness.  Musculoskeletal:        General: Normal range of motion.     Cervical back: Neck supple.  Skin:    General: Skin is warm and dry.  Neurological:     Mental Status: She is alert and oriented to person, place, and time.     Cranial Nerves: No cranial nerve deficit.     Deep Tendon Reflexes:     Reflex Scores:      Patellar reflexes are 2+ on the right side and 2+ on the left side. Psychiatric:        Mood and Affect: Mood normal.           Assessment & Plan:  Preventative health care Assessment & Plan: Immunizations UTD. Mammogram and bone density scan UTD Colon cancer screening due in July 2025, discussed today Denies lung cancer screening.  Discussed the importance of a healthy diet and regular exercise in order for weight loss, and to reduce the risk of further co-morbidity.  Exam stable. Labs pending.  Follow up in 1 year for repeat physical.    Anxiety and depression Assessment & Plan: Controlled.  Continue venlafaxine ER 150 mg daily.  Continue to monitor.    Hyperlipidemia, unspecified hyperlipidemia type Assessment & Plan: Repeat lipid panel pending. Continue rosuvastatin 10 mg daily.  Orders: -     Comprehensive metabolic panel with GFR -     Lipid panel  Prediabetes Assessment & Plan: Repeat A1C pending.  Orders: -     Hemoglobin A1c  Screening for colon cancer -     Cologuard  Essential hypertension Assessment &  Plan: Stable.  Continue amlodipine 10 mg daily, olmesartan 20 mg daily.         Doreene Nest, NP

## 2024-03-02 NOTE — Assessment & Plan Note (Signed)
 Immunizations UTD. Mammogram and bone density scan UTD Colon cancer screening due in July 2025, discussed today Denies lung cancer screening.  Discussed the importance of a healthy diet and regular exercise in order for weight loss, and to reduce the risk of further co-morbidity.  Exam stable. Labs pending.  Follow up in 1 year for repeat physical.

## 2024-03-02 NOTE — Assessment & Plan Note (Signed)
 Repeat A1C pending.

## 2024-03-02 NOTE — Assessment & Plan Note (Signed)
 Stable.  Continue amlodipine 10 mg daily, olmesartan 20 mg daily.

## 2024-03-02 NOTE — Patient Instructions (Addendum)
 Stop by the lab prior to leaving today. I will notify you of your results once received.   Complete the Cologuard kit for colon cancer screening in late July or early August.  It was a pleasure to see you today!

## 2024-03-02 NOTE — Assessment & Plan Note (Signed)
 Controlled.  Continue venlafaxine ER 150 mg daily.  Continue to monitor.

## 2024-03-02 NOTE — Assessment & Plan Note (Signed)
 Repeat lipid panel pending. Continue rosuvastatin 10 mg daily.

## 2024-03-07 ENCOUNTER — Other Ambulatory Visit: Payer: Self-pay | Admitting: Primary Care

## 2024-03-07 DIAGNOSIS — E785 Hyperlipidemia, unspecified: Secondary | ICD-10-CM

## 2024-03-30 ENCOUNTER — Other Ambulatory Visit: Payer: Self-pay | Admitting: Primary Care

## 2024-03-30 DIAGNOSIS — I10 Essential (primary) hypertension: Secondary | ICD-10-CM

## 2024-03-30 DIAGNOSIS — F330 Major depressive disorder, recurrent, mild: Secondary | ICD-10-CM

## 2024-04-02 ENCOUNTER — Ambulatory Visit (INDEPENDENT_AMBULATORY_CARE_PROVIDER_SITE_OTHER): Payer: Medicare PPO

## 2024-04-02 VITALS — BP 132/80 | Ht 64.0 in | Wt 175.0 lb

## 2024-04-02 DIAGNOSIS — Z0000 Encounter for general adult medical examination without abnormal findings: Secondary | ICD-10-CM

## 2024-04-02 NOTE — Progress Notes (Signed)
 Because this visit was a virtual/telehealth visit,  certain criteria was not obtained, such a blood pressure, CBG if applicable, and timed get up and go. Any medications not marked as "taking" were not mentioned during the medication reconciliation part of the visit. Any vitals not documented were not able to be obtained due to this being a telehealth visit or patient was unable to self-report a recent blood pressure reading due to a lack of equipment at home via telehealth. Vitals that have been documented are verbally provided by the patient.   This visit was performed by a medical professional under my direct supervision. I was immediately available for consultation/collaboration. I have reviewed and agree with the Annual Wellness Visit documentation.  Subjective:   Theresa Bridges is a 68 y.o. who presents for a Medicare Wellness preventive visit.  Visit Complete: Virtual I connected with  Theresa Bridges on 04/02/24 by a audio enabled telemedicine application and verified that I am speaking with the correct person using two identifiers.  Patient Location: Home  Provider Location: Home Office  I discussed the limitations of evaluation and management by telemedicine. The patient expressed understanding and agreed to proceed.  Vital Signs: Because this visit was a virtual/telehealth visit, some criteria may be missing or patient reported. Any vitals not documented were not able to be obtained and vitals that have been documented are patient reported.  VideoDeclined- This patient declined Librarian, academic. Therefore the visit was completed with audio only.  Persons Participating in Visit: Patient.  AWV Questionnaire: No: Patient Medicare AWV questionnaire was not completed prior to this visit.  Cardiac Risk Factors include: advanced age (>65men, >68 women);obesity (BMI >30kg/m2);dyslipidemia;hypertension     Objective:    Today's Vitals   04/02/24 1109  BP:  132/80  Weight: 175 lb (79.4 kg)  Height: 5\' 4"  (1.626 m)   Body mass index is 30.04 kg/m.     04/02/2024   11:08 AM  Advanced Directives  Does Patient Have a Medical Advance Directive? No  Would patient like information on creating a medical advance directive? No - Patient declined    Current Medications (verified) Outpatient Encounter Medications as of 04/02/2024  Medication Sig   amLODipine  (NORVASC ) 10 MG tablet TAKE ONE TABLET BY MOUTH DAILY FOR BLOOD PRESSURE   olmesartan  (BENICAR ) 20 MG tablet TAKE 1 TABLET BY MOUTH DAILY FOR FOR BLOOD PRESSURE   rosuvastatin  (CRESTOR ) 10 MG tablet TAKE 1 TABLET BY MOUTH DAILY FOR CHOLESTEROL   venlafaxine  XR (EFFEXOR -XR) 150 MG 24 hr capsule TAKE 1 CAPSULE BY MOUTH DAILY WITH BREAKFAST FOR ANXIETY   No facility-administered encounter medications on file as of 04/02/2024.    Allergies (verified) Patient has no known allergies.   History: Past Medical History:  Diagnosis Date   Arthritis    mild   Chicken pox    Depression    Hypertension    Stress incontinence    Past Surgical History:  Procedure Laterality Date   KNEE SURGERY     ROTATOR CUFF REPAIR     Family History  Problem Relation Age of Onset   Arthritis Mother    Cancer Mother        breast   Breast cancer Mother 42       102   Arthritis Father    Hypertension Father    Cancer Paternal Grandmother        breast   Breast cancer Paternal Grandmother 95   Social History  Socioeconomic History   Marital status: Married    Spouse name: Not on file   Number of children: Not on file   Years of education: Not on file   Highest education level: Not on file  Occupational History   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  Substance and Sexual Activity   Alcohol use: Yes    Alcohol/week: 8.0 standard drinks of alcohol    Types: 8 Glasses of wine per week    Comment: 1 to 2 glass of wine daily   Drug use: Never   Sexual activity: Not Currently  Other Topics Concern   Not on file  Social History Narrative   Married.   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   Enjoys swimming, gardening, growing herbs, playing piano, reading.   Social Drivers of Corporate investment banker Strain: Low Risk  (04/02/2024)   Overall Financial Resource Strain (CARDIA)    Difficulty of Paying Living Expenses: Not hard at all  Food Insecurity: No Food Insecurity (04/02/2024)   Hunger Vital Sign    Worried About Running Out of Food in the Last Year: Never true    Ran Out of Food in the Last Year: Never true  Transportation Needs: No Transportation Needs (04/02/2024)   PRAPARE - Administrator, Civil Service (Medical): No    Lack of Transportation (Non-Medical): No  Physical Activity: Insufficiently Active (04/02/2024)   Exercise Vital Sign    Days of Exercise per Week: 3 days    Minutes of Exercise per Session: 30 min  Stress: No Stress Concern Present (04/02/2024)   [employer] of Occupational Health - Occupational Stress Questionnaire    Feeling of Stress : Not at all  Social Connections: Moderately Isolated (04/02/2024)   Social Connection and Isolation Panel [NHANES]    Frequency of Communication with Friends and Family: More than three times a week    Frequency of Social Gatherings with Friends and Family: Twice a week    Attends Religious Services: Never    Database administrator or Organizations: No    Attends Engineer, structural: Never    Marital Status: Married    Tobacco Counseling Ready to quit: Not Answered Counseling given: Not Answered Tobacco comments: No comment.    Clinical Intake:  Pre-visit preparation completed: Yes  Pain : No/denies pain     BMI -  recorded: 30.04 Nutritional Status: BMI > 30  Obese Nutritional Risks: None Diabetes: No  Lab Results  Component Value Date   HGBA1C 6.0 03/02/2024   HGBA1C 6.0 02/04/2023   HGBA1C 6.1 01/21/2022     How often do you need to have someone help you when you read instructions, pamphlets, or other written materials from your doctor or pharmacy?: 1 - Never  Interpreter Needed?: No  Comments: Master degree Information entered by :: Delpha Fickle   Activities of Daily Living     04/02/2024   11:12 AM  In your present state of health, do you have any difficulty performing the following activities:  Hearing? 0  Vision? 0  Difficulty concentrating or making decisions? 0  Walking or climbing stairs? 0  Dressing  or bathing? 0  Doing errands, shopping? 0  Preparing Food and eating ? N  Using the Toilet? N  In the past six months, have you accidently leaked urine? Y  Comment sometime when laughing  Do you have problems with loss of bowel control? N  Managing your Medications? N  Managing your Finances? N  Housekeeping or managing your Housekeeping? N    Patient Care Team: Gabriel John, NP as PCP - General (Nurse Practitioner)  Indicate any recent Medical Services you may have received from other than [facility] providers in the past year (date may be approximate).     Assessment:   This is a routine wellness examination for [facility].  Hearing/Vision screen Hearing Screening - Comments:: No hearing difficulties Vision Screening - Comments:: Wears glasses   Goals Addressed               This Visit's Progress     Patient Stated (pt-stated)        Get back to swimming       Depression Screen     04/02/2024   11:15 AM 03/02/2024   12:12 PM 02/04/2023   10:28 AM 04/28/2022   10:50 AM 01/21/2022    8:06 AM 12/19/2020   10:20 AM  PHQ 2/9 Scores  PHQ - 2 Score 0 0 0 0 0 6  PHQ- 9 Score 0 0 0 0 0 24    Fall Risk     04/02/2024   11:12 AM 03/02/2024   12:12 PM  02/04/2023   10:28 AM 12/19/2020   10:20 AM  Fall Risk   Falls in the past year? 0 0 0 0  Number falls in past yr: 0 0 0 0  Injury with Fall? 0 0 0 0  Risk for fall due to : No Fall Risks No Fall Risks No Fall Risks   Follow up Falls prevention discussed;Falls evaluation completed Falls evaluation completed Falls evaluation completed     MEDICARE RISK AT HOME:  Medicare Risk at Home Any stairs in or around the home?: Yes If so, are there any without handrails?: No Home free of loose throw rugs in walkways, pet beds, electrical cords, etc?: Yes Adequate lighting in your home to reduce risk of falls?: Yes Life alert?: No Use of a cane, walker or w/c?: No Grab bars in the bathroom?: No Shower chair or bench in shower?: No Elevated toilet seat or a handicapped toilet?: No  TIMED UP AND GO:  Was the test performed?  no  Cognitive Function: 6CIT completed        04/02/2024   11:10 AM 02/04/2023   10:29 AM  6CIT Screen  What Year? 0 points 0 points  What month? 0 points 0 points  What time? 0 points 0 points  Count back from 20 0 points 0 points  Months in reverse 0 points 0 points  Repeat phrase 0 points 0 points  Total Score 0 points 0 points    Immunizations Immunization History  Administered Date(s) Administered   Hep B, Unspecified 05/23/2001, 06/27/2001   Influenza, High Dose Seasonal PF 09/10/2023   Influenza, Quadrivalent, Recombinant, Inj, Pf 09/27/2021, 09/10/2022   Influenza, Seasonal, Injecte, Preservative Fre 09/15/2018   Influenza,inj,Quad PF,6+ Mos 09/11/2019   Influenza-Unspecified 09/05/2020, 09/05/2021, 09/09/2022, 09/10/2023   PFIZER Comirnaty(Gray Top)Covid-19 Tri-Sucrose Vaccine 07/10/2021   PFIZER(Purple Top)SARS-COV-2 Vaccination 02/02/2020, 03/01/2020, 09/11/2020   PNEUMOCOCCAL CONJUGATE-20 10/24/2023   Pfizer Covid-19 Vaccine Bivalent Booster 66yrs & up 09/27/2021   Pfizer(Comirnaty)Fall  Seasonal Vaccine 12 years and older 09/10/2022, 09/10/2023    Rsv, Bivalent, Protein Subunit Rsvpref,pf Pattricia Bores) 10/24/2023   Td 03/15/2016   Unspecified SARS-COV-2 Vaccination 09/10/2023   Varicella 12/20/2019   Zoster Recombinant(Shingrix) 12/19/2020, 06/15/2021    Screening Tests Health Maintenance  Topic Date Due   Medicare Annual Wellness (AWV)  02/04/2024   Fecal DNA (Cologuard)  06/22/2024   INFLUENZA VACCINE  07/06/2024   MAMMOGRAM  09/07/2025   DTaP/Tdap/Td (2 - Tdap) 03/15/2026   Pneumonia Vaccine 76+ Years old  Completed   DEXA SCAN  Completed   Hepatitis C Screening  Completed   Zoster Vaccines- Shingrix  Completed   HPV VACCINES  Aged Out   Meningococcal B Vaccine  Aged Out   COVID-19 Vaccine  Discontinued    Health Maintenance  Health Maintenance Due  Topic Date Due   Medicare Annual Wellness (AWV)  02/04/2024   Health Maintenance Items Addressed:  Additional Screening:  Vision Screening: Recommended annual ophthalmology exams for early detection of glaucoma and other disorders of the eye.  Dental Screening: Recommended annual dental exams for proper oral hygiene  Community Resource Referral / Chronic Care Management: CRR required this visit?  No   CCM required this visit?  No     Plan:     I have personally reviewed and noted the following in the patient's chart:   Medical and social history Use of alcohol, tobacco or illicit drugs  Current medications and supplements including opioid prescriptions. Patient is not currently taking opioid prescriptions. Functional ability and status Nutritional status Physical activity Advanced directives List of other physicians Hospitalizations, surgeries, and ER visits in previous 12 months Vitals Screenings to include cognitive, depression, and falls Referrals and appointments  In addition, I have reviewed and discussed with patient certain preventive protocols, quality metrics, and best practice recommendations. A written personalized care plan for preventive  services as well as general preventive health recommendations were provided to patient.     Freeda Jerry, [place]   04/02/2024   After Visit Summary: (MyChart) Due to this being a telephonic visit, the after visit summary with patients personalized plan was offered to patient via MyChart   Notes: Nothing significant to report at this time.

## 2024-04-02 NOTE — Patient Instructions (Signed)
 Theresa Bridges , Thank you for taking time to come for your Medicare Wellness Visit. I appreciate your ongoing commitment to your health goals. Please review the following plan we discussed and let me know if I can assist you in the future.   Referrals/Orders/Follow-Ups/Clinician Recommendations: follow up as scheduled  This is a list of the screening recommended for you and due dates:  Health Maintenance  Topic Date Due   Cologuard (Stool DNA test)  06/22/2024   Flu Shot  07/06/2024   Medicare Annual Wellness Visit  04/02/2025   Mammogram  09/07/2025   DTaP/Tdap/Td vaccine (2 - Tdap) 03/15/2026   Pneumonia Vaccine  Completed   DEXA scan (bone density measurement)  Completed   Hepatitis C Screening  Completed   Zoster (Shingles) Vaccine  Completed   HPV Vaccine  Aged Out   Meningitis B Vaccine  Aged Out   COVID-19 Vaccine  Discontinued    Advanced directives: (Declined) Advance directive discussed with you today. Even though you declined this today, please call our office should you change your mind, and we can give you the proper paperwork for you to fill out.  Next Medicare Annual Wellness Visit scheduled for next year: Yes

## 2024-05-09 ENCOUNTER — Encounter: Payer: Medicare PPO | Admitting: Primary Care

## 2024-06-29 ENCOUNTER — Other Ambulatory Visit: Payer: Self-pay | Admitting: Primary Care

## 2024-06-29 DIAGNOSIS — I10 Essential (primary) hypertension: Secondary | ICD-10-CM

## 2024-06-29 DIAGNOSIS — F330 Major depressive disorder, recurrent, mild: Secondary | ICD-10-CM

## 2024-07-29 ENCOUNTER — Encounter

## 2024-07-30 ENCOUNTER — Ambulatory Visit: Payer: Self-pay

## 2024-07-30 NOTE — Telephone Encounter (Signed)
 Will see patient then Agree with ER and UC precautions

## 2024-07-30 NOTE — Telephone Encounter (Signed)
 FYI Only or Action Required?: FYI only for provider.  Patient was last seen in primary care on 03/02/2024 by Gretta Comer POUR, NP.  Called Nurse Triage reporting Facial Pain.  Symptoms began several weeks ago.  Interventions attempted: OTC medications: ibuprofen.  Symptoms are: gradually worsening.  Triage Disposition: See PCP When Office is Open (Within 3 Days)  Patient/caregiver understands and will follow disposition?: Yes    Copied from CRM #[id number]. Topic: Clinical - Red Word Triage >> Jul 30, 2024 11:57 AM Vena HERO wrote: Red Word that prompted transfer to Nurse Triage: pt calling in sick for over a week and complaints of major head pressure, sore throat and cough. Has been taking ibuprofen for the pain but is not getting much relief Reason for Disposition  [1] Sinus congestion (pressure, fullness) AND [2] present > 10 days  Answer Assessment - Initial Assessment Questions 1. LOCATION: Where does it hurt?      Front of head, around eyes and nose 2. ONSET: When did the sinus pain start?  (e.g., hours, days)      Over a week 3. SEVERITY: How bad is the pain?   (Scale 0-10; or none, mild, moderate or severe)     2/10 4. RECURRENT SYMPTOM: Have you ever had sinus problems before? If Yes, ask: When was the last time? and What happened that time?      Yes  5. NASAL CONGESTION: Is the nose blocked? If Yes, ask: Can you open it or must you breathe through your mouth?     No congestion 6. NASAL DISCHARGE: Do you have discharge from your nose? If so ask, What color?     Drippy clear nose 7. FEVER: Do you have a fever? If Yes, ask: What is it, how was it measured, and when did it start?      no 8. OTHER SYMPTOMS: Do you have any other symptoms? (e.g., sore throat, cough, earache, difficulty breathing)     Sore throat, body aches and chills, cough  Protocols used: Sinus Pain or Congestion-A-AH

## 2024-07-31 ENCOUNTER — Ambulatory Visit (INDEPENDENT_AMBULATORY_CARE_PROVIDER_SITE_OTHER): Admitting: Family Medicine

## 2024-07-31 ENCOUNTER — Encounter: Payer: Self-pay | Admitting: Family Medicine

## 2024-07-31 VITALS — BP 128/60 | HR 107 | Temp 97.9°F | Ht 64.0 in

## 2024-07-31 DIAGNOSIS — J019 Acute sinusitis, unspecified: Secondary | ICD-10-CM | POA: Insufficient documentation

## 2024-07-31 DIAGNOSIS — J0100 Acute maxillary sinusitis, unspecified: Secondary | ICD-10-CM

## 2024-07-31 DIAGNOSIS — F17200 Nicotine dependence, unspecified, uncomplicated: Secondary | ICD-10-CM

## 2024-07-31 MED ORDER — AMOXICILLIN-POT CLAVULANATE 875-125 MG PO TABS: 1.0000 | ORAL_TABLET | Freq: Two times a day (BID) | ORAL | 0 refills | Status: AC

## 2024-07-31 NOTE — Assessment & Plan Note (Signed)
 10 days after start of viral uri in a smoker  Sinus pressure/pain /purulent discharge  Neg covid tests at home  Reassuring exam  Disc symptomatic care - see instructions on AVS  Expectorant may help Ibuprofen prn   Augmentin - take as directed  Handout given   Update if not starting to improve in a week or if worsening  Call back and Er precautions noted in detail today    Encouraged to quit smoking

## 2024-07-31 NOTE — Patient Instructions (Addendum)
 Drink fluids and rest  mucinex DM is good for cough and congestion  Nasal saline for congestion as needed  Tylenol or ibuprofen for fever or pain or headache  Please alert us  if symptoms worsen (if severe or short of breath please go to the ER)   Update if not starting to improve in a week or if worsening   Take the augmentin  for sinus infection    Think about quitting smoking

## 2024-07-31 NOTE — Assessment & Plan Note (Signed)
 Disc in detail risks of smoking and possible outcomes including copd, vascular/ heart disease, cancer , respiratory and sinus infections as well as osteoporosis  Pt voices understanding  Pt is not interested in quitting currently

## 2024-07-31 NOTE — Progress Notes (Signed)
 Subjective:    Patient ID: Theresa Bridges, female    DOB: 02-Jun-1956, 68 y.o.   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   Symptoms started about 10 d ago  Started with chills/ body aches and runny nose and headache  Took some ibuprofen and it helped  Then bad ST  Ears hurt when she swallowed  2 covid tests were negative   Now  Pressure around her eyes  Discomfort more than pain  Eyes are dickie diania ST in ams  Ears feel stopped up  More nasal congestion now  Lot of coughing-now loose / phlegm is yellow  No wheeze No shortness of breath   Aches are off and on  No fever     BP Readings from Last 3 Encounters:  07/31/24 128/60  04/02/24 132/80  03/02/24 132/80   Pulse Readings from Last 3 Encounters:  07/31/24 (!) 107  03/02/24 (!) 108  02/04/23 60   Pulse rate is always high     Over the counter  Ibuprofen  Throat lozenges        Patient Active Problem List   Diagnosis Date Noted   Acute sinusitis 07/31/2024   Welcome to Medicare preventive visit 02/04/2023   Arthralgia of both hands 04/28/2022   Pain of right hand 04/16/2022   Hyperlipidemia 01/21/2022   Tobacco dependence 01/21/2022   Prediabetes 01/21/2022   Right foot pain 12/08/2021   Preventative health care 03/15/2016   Essential hypertension 04/30/2015   Anxiety and depression 04/30/2015   Past Medical History:  Diagnosis Date   Arthritis    mild   Chicken pox    Depression    Hypertension    Stress incontinence    Past Surgical History:  Procedure Laterality Date   KNEE SURGERY     ROTATOR  CUFF REPAIR     Social History   Tobacco Use   Smoking status: Every Day    Current packs/day: 0.25    Average packs/day: 0.3 packs/day for 15.0 years (3.8 ttl pk-yrs)    Types: Cigarettes   Smokeless tobacco: Never   Tobacco comments:    No comment.  Substance Use Topics   Alcohol use: Yes    Alcohol/week: 8.0 standard drinks of alcohol    Types: 8 Glasses of wine per week    Comment: 1 to 2 glass of wine daily   Drug use: Never   Family History  Problem Relation Age of Onset   Arthritis Mother    Cancer Mother        breast   Breast cancer Mother 50       21   Arthritis Father    Hypertension Father    Cancer Paternal Grandmother        breast   Breast cancer Paternal Grandmother 50   No Known Allergies Current Outpatient Medications on File Prior to Visit  Medication Sig Dispense Refill   amLODipine  (NORVASC ) 10 MG tablet TAKE 1  TABLET BY MOUTH DAILY FOR BLOOD PRESSURE 90 tablet 1   olmesartan  (BENICAR ) 20 MG tablet TAKE 1 TABLET BY MOUTH DAILY FOR BLOOD PRESSURE 90 tablet 1   rosuvastatin  (CRESTOR ) 10 MG tablet TAKE 1 TABLET BY MOUTH DAILY FOR CHOLESTEROL 90 tablet 3   venlafaxine  XR (EFFEXOR -XR) 150 MG 24 hr capsule TAKE 1 CAPSULE BY MOUTH DAILY WITH BREAKFAST FOR ANXIETY 90 capsule 1   No current facility-administered medications on file prior to visit.    Review of Systems  Constitutional:  Positive for fatigue. Negative for appetite change and fever.  HENT:  Positive for congestion, ear pain, postnasal drip, rhinorrhea, sinus pressure and sore throat. Negative for nosebleeds.   Eyes:  Negative for pain, redness and itching.  Respiratory:  Positive for cough. Negative for shortness of breath, wheezing and stridor.   Cardiovascular:  Negative for chest pain.  Gastrointestinal:  Negative for abdominal pain, diarrhea, nausea and vomiting.  Endocrine: Negative for polyuria.  Genitourinary:  Negative for dysuria, frequency and urgency.  Musculoskeletal:  Negative  for arthralgias and myalgias.  Allergic/Immunologic: Negative for immunocompromised state.  Neurological:  Positive for headaches. Negative for dizziness, tremors, syncope, weakness and numbness.  Hematological:  Negative for adenopathy. Does not bruise/bleed easily.  Psychiatric/Behavioral:  Negative for dysphoric mood. The patient is not nervous/anxious.        Objective:   Physical Exam Constitutional:      General: She is not in acute distress.    Appearance: Normal appearance. She is well-developed. She is obese. She is not ill-appearing or diaphoretic.  HENT:     Head: Normocephalic and atraumatic.     Comments: Bilateral maxillary and frontal sinus tenderness- mild but worse on the left     Right Ear: Tympanic membrane and external ear normal.     Left Ear: Tympanic membrane and external ear normal.     Nose: Congestion and rhinorrhea present.     Mouth/Throat:     Pharynx: Oropharynx is clear. No oropharyngeal exudate or posterior oropharyngeal erythema.     Comments: Clear pnd Eyes:     General:        Right eye: Discharge present.        Left eye: Discharge present.    Pupils: Pupils are equal, round, and reactive to light.     Comments: Eyes are watering  Mild conj injection   Cardiovascular:     Rate and Rhythm: Regular rhythm. Tachycardia present.  Pulmonary:     Effort: Pulmonary effort is normal. No respiratory distress.     Breath sounds: Normal breath sounds. No stridor. No wheezing, rhonchi or rales.     Comments: Good air exch No rales or rhonchi  Upper airway sounds cleared by cough  Wet sounding cough  Musculoskeletal:     Cervical back: Normal range of motion and neck supple.  Lymphadenopathy:     Cervical: No cervical adenopathy.  Skin:    General: Skin is warm and dry.     Findings: No rash.  Neurological:     Mental Status: She is alert.     Cranial Nerves: No cranial nerve deficit.     Coordination: Coordination normal.  Psychiatric:         Mood and Affect: Mood normal.           Assessment & Plan:   Problem List Items Addressed This Visit       Respiratory   Acute sinusitis - Primary   10 days after  start of viral uri in a smoker  Sinus pressure/pain /purulent discharge  Neg covid tests at home  Reassuring exam  Disc symptomatic care - see instructions on AVS  Expectorant may help Ibuprofen prn   Augmentin - take as directed  Handout given   Update if not starting to improve in a week or if worsening  Call back and Er precautions noted in detail today    Encouraged to quit smoking      Relevant Medications   amoxicillin -clavulanate (AUGMENTIN ) 875-125 MG tablet     Other   Tobacco dependence   Disc in detail risks of smoking and possible outcomes including copd, vascular/ heart disease, cancer , respiratory and sinus infections as well as osteoporosis  Pt voices understanding  Pt is not interested in quitting currently

## 2024-12-17 ENCOUNTER — Ambulatory Visit: Admitting: Internal Medicine

## 2024-12-17 ENCOUNTER — Ambulatory Visit: Payer: Self-pay

## 2024-12-17 ENCOUNTER — Encounter: Payer: Self-pay | Admitting: Internal Medicine

## 2024-12-17 ENCOUNTER — Ambulatory Visit
Admission: RE | Admit: 2024-12-17 | Discharge: 2024-12-17 | Disposition: A | Source: Ambulatory Visit | Attending: Internal Medicine

## 2024-12-17 ENCOUNTER — Ambulatory Visit
Admission: RE | Admit: 2024-12-17 | Discharge: 2024-12-17 | Disposition: A | Discharge status: Home / Self Care | Attending: Internal Medicine | Admitting: Internal Medicine

## 2024-12-17 VITALS — BP 140/72 | HR 102 | Temp 98.7°F | Resp 18 | Ht 64.0 in | Wt 185.8 lb

## 2024-12-17 DIAGNOSIS — M25532 Pain in left wrist: Secondary | ICD-10-CM | POA: Insufficient documentation

## 2024-12-17 NOTE — Telephone Encounter (Signed)
 Noted. Appreciate [facility]'s evaluation.

## 2024-12-17 NOTE — Progress Notes (Signed)
 "  Acute Office Visit  Subjective:     Patient ID: Theresa Bridges, female    DOB: December 06, 1956, 69 y.o.   MRN: [id number]  Chief Complaint  Patient presents with   Wrist Pain    Left since last Thursday picking up cast iron pan    Wrist Pain    Patient is in today for left wrist pain. This is my first time meeting her.   Discussed the use of AI scribe software for clinical note transcription with the patient, who gave verbal consent to proceed.  History of Present Illness Theresa Bridges is a 69 year old female who presents with wrist pain following an injury.  She developed acute left wrist pain on Thursday after a sudden twisting motion while lifting a heavy cast iron skillet with one hand. Initial severe pain radiated down her arm but has resolved. By the next day, pain with picking up light objects caused near syncope due to intensity.  By Saturday the pain had partially improved but remains constant and localized to one area of the wrist, with intermittent shooting pains. Pain is triggered by gripping and lifting, and she finds lifting a coffee mug with her left hand very painful and difficult. She uses a wrist brace intermittently and wears it at night to avoid being woken by pain.  She takes over-the-counter Aleve as needed, which helps more than ibuprofen, but she still has significant discomfort with wrist motion and gripping.  She does frequent typing and now has difficulty with tasks such as unbuckling her seatbelt due to wrist pain and weakness.   Review of Systems  Musculoskeletal:  Positive for joint pain.  Skin: Negative.         Objective:    BP (!) 140/72   Pulse (!) 102   Temp 98.7 F (37.1 C)   Resp 18   Ht 5' 4 (1.626 m)   Wt 185 lb 12.8 oz (84.3 kg)   SpO2 98%   BMI 31.89 kg/m    Physical Exam Constitutional:      Appearance: Normal appearance.  HENT:     Head: Normocephalic and atraumatic.  Eyes:     Conjunctiva/sclera: Conjunctivae  normal.  Cardiovascular:     Rate and Rhythm: Normal rate and regular rhythm.  Pulmonary:     Effort: Pulmonary effort is normal.     Breath sounds: Normal breath sounds.  Musculoskeletal:     Left wrist: Swelling, tenderness, bony tenderness and snuff box tenderness present. Decreased range of motion.  Skin:    General: Skin is warm and dry.  Neurological:     General: No focal deficit present.     Mental Status: She is alert. Mental status is at baseline.  Psychiatric:        Mood and Affect: Mood normal.        Behavior: Behavior normal.     No results found for any visits on 12/17/24.      Assessment & Plan:   Assessment & Plan Left wrist pain with suspected tendinopathy Acute left wrist pain post-trauma. X-ray ordered to exclude fracture with snuffbox pain. - Ordered x-ray of left wrist to rule out fracture. - Continue wrist brace use, especially at night and during fine motor activities. - Recommended Aleve (naproxen) 250 mg four times daily or 500 mg twice daily with food. - Advised follow-up with PCP if pain persists beyond two weeks for repeat x-ray.  - DG Wrist Complete Left; Future  Return if symptoms worsen or fail to improve.  Sharyle Fischer, DO   "

## 2024-12-17 NOTE — Telephone Encounter (Signed)
 FYI Only or Action Required?: FYI only for provider: appointment scheduled on 12/17/24.  Patient was last seen in primary care on 07/31/2024 by Randeen Laine LABOR, MD.  Called Nurse Triage reporting Wrist Injury.  Symptoms began several days ago.  Interventions attempted: OTC medications: ibuprofen and Ice/heat application.  Symptoms are: gradually worsening.  Triage Disposition: See Physician Within 24 Hours  Patient/caregiver understands and will follow disposition?: Yes   Copied from CRM #[id number]. Topic: Clinical - Red Word Triage >> Dec 17, 2024  8:07 AM Suzen RAMAN wrote: Red Word that prompted transfer to Nurse Triage: wrist injury with shooting pain currently at 3 but increase to 7 periodically and pain travels down into thumb(holding cast iron skillet with one hand was the cause per patient) Reason for Disposition  [1] MODERATE pain (e.g., interferes with normal activities) AND [2] high-risk adult (e.g., age > 60 years, osteoporosis, chronic steroid use)  Answer Assessment - Initial Assessment Questions Scheduled 12/17/24 Advised call back or ED/911 if symptoms worsen. Patient verbalized understanding.    1. MECHANISM: How did the injury happen?     something snapped after picking up heavy skillet, base of thumb to thumb. 2. ONSET: When did the injury happen? (e.g., minutes or hours ago)      Thursaday 3. APPEARANCE of INJURY: What does the injury look like?      no 4. SEVERITY: Can you use your wrist normally? Can you move your wrist back and forth? Can you hold something in your hand?     Can't pick up objects with certain movement 5. SIZE: For cuts, bruises, or swelling, ask: How large is it? (e.g., inches or centimeters; entire wrist)      no 6. PAIN: How bad is the pain? (Scale 0-10; or none, mild, moderate, severe)     3/10 constant pain, intermittents 7/10  Protocols used: Wrist Injury-A-AH

## 2024-12-24 ENCOUNTER — Ambulatory Visit: Payer: Self-pay | Admitting: Internal Medicine

## 2025-04-03 ENCOUNTER — Ambulatory Visit
